# Patient Record
Sex: Male | Born: 1945
Health system: Southern US, Community
[De-identification: ages and names within clinical notes are randomized; demographics above are authoritative.]

## PROBLEM LIST (undated history)

## (undated) DIAGNOSIS — I214 Non-ST elevation (NSTEMI) myocardial infarction: Secondary | ICD-10-CM

## (undated) DIAGNOSIS — I2510 Atherosclerotic heart disease of native coronary artery without angina pectoris: Secondary | ICD-10-CM

## (undated) DIAGNOSIS — I10 Essential (primary) hypertension: Secondary | ICD-10-CM

## (undated) DIAGNOSIS — N529 Male erectile dysfunction, unspecified: Secondary | ICD-10-CM

## (undated) DIAGNOSIS — E785 Hyperlipidemia, unspecified: Secondary | ICD-10-CM

## (undated) DIAGNOSIS — E669 Obesity, unspecified: Secondary | ICD-10-CM

## (undated) HISTORY — DX: Hyperlipidemia, unspecified: E78.5

## (undated) HISTORY — DX: Obesity, unspecified: E66.9

## (undated) HISTORY — DX: Non-ST elevation (NSTEMI) myocardial infarction: I21.4

## (undated) HISTORY — DX: Atherosclerotic heart disease of native coronary artery without angina pectoris: I25.10

## (undated) HISTORY — DX: Male erectile dysfunction, unspecified: N52.9

## (undated) HISTORY — DX: Essential (primary) hypertension: I10

---
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
## 1964-06-10 HISTORY — PX: CYST EXCISION: SHX5701

## 2002-06-10 HISTORY — PX: LEG SURGERY: SHX1003

## 2003-03-06 ENCOUNTER — Inpatient Hospital Stay (HOSPITAL_COMMUNITY): Admission: EM | Admit: 2003-03-06 | Discharge: 2003-03-08 | Payer: Self-pay | Admitting: *Deleted

## 2003-03-06 ENCOUNTER — Encounter: Payer: Self-pay | Admitting: Orthopedic Surgery

## 2003-03-06 ENCOUNTER — Encounter: Payer: Self-pay | Admitting: *Deleted

## 2007-10-12 ENCOUNTER — Ambulatory Visit: Payer: Self-pay | Admitting: *Deleted

## 2007-10-12 ENCOUNTER — Encounter: Payer: Self-pay | Admitting: Emergency Medicine

## 2007-10-12 ENCOUNTER — Inpatient Hospital Stay (HOSPITAL_COMMUNITY): Admission: AD | Admit: 2007-10-12 | Discharge: 2007-10-14 | Payer: Self-pay | Admitting: Cardiology

## 2007-10-12 DIAGNOSIS — I214 Non-ST elevation (NSTEMI) myocardial infarction: Secondary | ICD-10-CM

## 2007-10-12 HISTORY — DX: Non-ST elevation (NSTEMI) myocardial infarction: I21.4

## 2007-10-13 HISTORY — PX: CORONARY ANGIOPLASTY WITH STENT PLACEMENT: SHX49
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
## 2010-10-23 NOTE — Discharge Summary (Signed)
NAMEMELDRICK, Jesus [name]                  ACCOUNT NO.:  [id number]   MEDICAL RECORD NO.:  [id number]          PATIENT TYPE:  INP   LOCATION:  6532                         FACILITY:  [facility]   PHYSICIAN:  Mobolaji B. Bakare, M.D.DATE OF BIRTH:  12-01-1945   DATE OF ADMISSION:  10/12/2007  DATE OF DISCHARGE:  10/14/2007                               DISCHARGE SUMMARY   PRIMARY CARE PHYSICIAN:  Jonita Albee, MD   FINAL DIAGNOSES:  1. Non-ST elevation myocardial infarction.  2. Hyperlipidemia.  3. Hypertension.  4. Obesity.   PROCEDURE:  1. Cardiac catheterization done by Dr. Jacinto Halim on Oct 13, 2007.  2. Chest x-ray done on Oct 12, 2007 showed cardiomegaly without      evidence of acute cardiopulmonary disease.   CONSULTATIONS:  Cardiology consult provided by Dr. Tawni Millers.  Hochrein.   BRIEF HISTORY:  Please refer to the admission H and P dictated by Dr.  Hannah Beat.  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  He has history of hypertension and  dyslipidemia and has been on some blood pressure pills and aspirin prior  to admission.  He came to the emergency room and was evaluated and felt  to have unstable angina.  The patient was admitted and started on  aspirin, beta blocker at [facility].  He was evaluated by Dr. Jacinto Halim.  The  patient was subsequently started on Integrilin, Lovenox, and Plavix.   He underwent cardiac catheterization on Oct 13, 2007.  Results include  75% stenosis mid LAD and mid circumflex disease.  The patient had stent  placement during the procedure to both mid circumflex and mid LAD.  Post-  procedure, the patient was continued on Integrilin protocol and this has  been discontinued along with Lovenox.  The patient is currently on  aspirin, Lopressor, Plavix, Crestor, and lisinopril.   Risk factors were assessed during the course of hospitalization.  Fasting lipid profile showed triglyceride of 405 making it impossible to  calculate LDL.  His HDL was 19.  The patient does have history of  dyslipidemia.  He was not on anticholesterol medication prior to  hospitalization.  He has been started on Crestor.   It should be mentioned that the patient ruled in for myocardial  infarction with elevated CK-MB of 20 and troponin of 2.30 at the  maximum.  The patient was seen by cardiac rehabilitation team and he  would follow up with outpatient recommendations.   DISCHARGE CONDITION:  Stable.   DISCHARGE VITAL SIGNS:  Blood pressure 129/73, O2 sats of 97% on room  air, and heart rate of 62%.   DISCHARGE MEDICATIONS:  1. Plavix 75 mg 1 daily.  2. Aspirin 325 mg daily.  3. Crestor 40 mg daily.  4. Toprol XL 25 mg daily.  5. Lisinopril 5 mg daily.  6. Nitroglycerin p.r.n.   DISCHARGE INSTRUCTIONS:  1. Resume work in 2 weeks/when cleared by Dr. Perrin Maltese.  2. Follow up with Dr. Perrin Maltese in  10-14 days.  Follow up with Dr. Jacinto Halim      in 2-3 weeks.  3. Continue with cardiac rehabilitation.  4. Follow up with Dr. Jacinto Halim with 2D echocardiogram if deemed      necessary.      Mobolaji B. Corky Downs, M.D.  Electronically Signed     MBB/MEDQ  D:  10/14/2007  T:  10/15/2007  Job:  [id number]   cc:   Jonita Albee, M.D.  Cristy Hilts. Jacinto Halim, MD

## 2010-10-23 NOTE — Consult Note (Signed)
Jesus [name], ARNETTE NO.:  [id number]   MEDICAL RECORD NO.:  [id number]          PATIENT TYPE:  INP   LOCATION:  4710                         FACILITY:  [facility]   PHYSICIAN:  Rod Holler, MD     DATE OF BIRTH:  1946/01/31   DATE OF CONSULTATION:  10/12/2007  DATE OF DISCHARGE:                                 CONSULTATION   REFERRING PHYSICIAN:  Vonna Kotyk R. Jacinto Halim, MD.   CHIEF COMPLAINT:  Chest pain.   HISTORY OF PRESENT ILLNESS:  Jesus [name] is a very pleasant 65 year old  male with a history of hypertension, possible hyperlipidemia who  presents to the emergency department with complaints of chest pain.  The  patient has had a recent history of exertional chest pain which is  relieved by rest.  Today at work, the patient had onset of chest  tightness with radiation to his left upper extremity, associated nausea  and diaphoresis.  The chest pain was stuttering throughout the day and  ultimately was not relieved by rest.  He presents to the emergency  department and his chest discomfort was improved with sublingual  nitroglycerin.  The patient has had no complaints of palpitations, no  PND, orthopnea, no lower extremity swelling, no syncope, or presyncope.  The patient wanted a second opinion regarding cardiac catheterization.   PAST MEDICAL HISTORY.:  1. Hypertension.  2. Hyperlipidemia.   MEDICATIONS:  1. Aspirin.  2. Plavix.  3. Integrilin.  4. Lopressor.  5. Lovenox.   ALLERGIES:  No known drug allergies.   SOCIAL HISTORY:  The patient is a former cigarette user, currently  smokes cigars.   FAMILY HISTORY:  A brother with history of congenital heart defects and  is status post heart transplant, no other history of premature coronary  disease.   REVIEW OF SYSTEMS:  All systems reviewed in detail are negative, except  as noted in the present illness.   PHYSICAL EXAMINATION:  VITAL SIGNS:  Temperature 97.3, heart rate 52,  respiratory rate 18, blood  pressure 153/87, and oxygen saturation 97%.  GENERAL:  Well-developed, slightly obese male, alert and oriented x3, no  apparent distress.  HEENT:  Atraumatic, normocephalic.  Pupils equal, round, and reactive to  light.  NECK:  Supple.  No adenopathy.  No JVD.  No carotid bruits.  CHEST/LUNGS:  Clear to auscultation bilaterally with equal bilateral  breath sounds.  CORONARY:  Regular rate and rhythm, normal rate, normal S1 and S2, no  murmurs, rubs or gallops.  ABDOMEN:  Soft, nontender, and nondistended.  EXTREMITIES:  No clubbing, cyanosis or edema.  NEUROLOGIC:  No focal deficits.   LABORATORY DATA:  Sodium 139, potassium 4.5, chloride 106, bicarb 26,  BUN 12, creatinine 1.1, glucose 95.  White blood cell count 6.6,  hematocrit 45.8, platelet count 233, CK-MB 1.9, 12.1, troponin less than  0.05, 0.72.  EKG is unavailable.  Chest x-ray shows possible  cardiomegaly.   IMPRESSION AND PLAN:  Jesus [name] is a 65 year old male who presented with  a non-ST elevation MI, wanted a second opinion regarding cardiac  catheterization.   PLAN:  Agree with plan for cardiac catheterization, and I have discussed  this at length with the patient and his wife.      Rod Holler, MD  Electronically Signed     TRK/MEDQ  D:  10/12/2007  T:  10/13/2007  Job:  [id number]

## 2010-10-23 NOTE — Consult Note (Signed)
[name], [name] NO.:  [id number]   MEDICAL RECORD NO.:  [id number]          PATIENT TYPE:  EMS   LOCATION:  ED                           [facility]ACILITY:  [facility]   PHYSICIAN:  Cristy Hilts. Jacinto Halim, MD       DATE OF BIRTH:  18-Mar-1946   DATE OF CONSULTATION:  10/12/2007  DATE OF DISCHARGE:                                 CONSULTATION   REFERRING PHYSICIAN:  Hettie Holstein, D.O.   REASON FOR CONSULTATION:  Chest pain and unstable angina.   HISTORY:  [name] is a pleasant 65 year old gentleman with a  history of hypertension and also hyperlipidemia who had been doing well  until this morning. He was at work place and suddenly developed a  heaviness in the middle of his chest with radiation to both his arms.  This episode lasted for about 4 hours on and off.  It was waxing and  waning in character.  This was associated with mild nausea and  diaphoresis.  Given the persistence of this chest discomfort, he drove  himself to [facility] emergency room, and he received aspirin and also  three sublingual nitroglycerin.  He eventually got relief of his chest  discomfort.   Presently he has no chest pain, no shortness of breath, no paroxysmal  nocturnal dyspnea or orthopnea.   REVIEW OF SYSTEMS:  He denies any bowel or bladder changes.  Denies any  neurologic weakness, tenderness,  recent weight loss.  He is not a  diabetic.  Other systems are negative.   PRESENT MEDICATIONS:  Blood pressure medications, names not known.   ALLERGIES:  No known drug allergies.   PAST MEDICAL HISTORY:  Significant for:  1. Hypertension.  2. Hyperlipidemia.   FAMILY HISTORY:  There is no history of premature coronary artery  disease in the family.  One of his brothers has a congenital cardiac  abnormality and needed to have cardiac transplant at the age of 20, but  he was one of twins, and he was born with butt problems.   SOCIAL HISTORY:  He is married and lives with his wife.  He  drinks  alcohol occasionally.  Does not smoke cigarettes or use any tobacco  products.  Does not have a formal exercise program but keeps himself  physically active at work place.   PHYSICAL EXAMINATION:  GENERAL:  He is well built, appears to be  overweight.  He is in no acute distress.  VITAL SIGNS:  Heart rate of 52 beats per minute, respirations 14,  blood  pressure 134/71 mmHg.  His oxygen saturation was 96% on room air.  CARDIAC:  S1 and S2 were normal.  There was no gallop or murmur.  CHEST:  Bilateral equal breath sounds, no crackles.  ABDOMEN:  Soft.  EXTREMITIES:  No edema.  Peripheral vascular exam was normal.   His EKG demonstrates normal sinus rhythm with left axis deviation.   His CBC was within normal limits.  Electrolytes with BUN and creatinine  within normal limits with BUN of 12, creatinine of 1.08.  Blood sugar  was 95.  Point-of-care markers first set:  Total CK 123, MB 1.9,  troponin of less than 0.05 which was negative.  Second set:  CPK was  155, MB 12.1 with a cardiac index of 7.8 and troponin of 0.72,  clearly  suggestive of nontransmural myocardial infarction.   IMPRESSION:  1. Non-ST-elevation myocardial infarction.  2. Hypertension.  3. Hyperlipidemia.   RECOMMENDATIONS:  The patient is presently asymptomatic.  Hence, he  needs aggressive medical therapy.  He should be continued on Lovenox and  will also start him on Integrilin.  He was already given a dose of  Plavix.  I will continue the same at this point.  I will also load him  with 80 mg Lipitor.  Will check a lipid profile on a nonfasting state  today.  1. He will eventually need cardiac catheterization in the morning      unless he develops unstable symptoms on an emergent basis at any      time.  I have explained to the patient and his wife regarding      medical therapy versus cardiac catheterization.  I also explained      risks, benefits, alternatives including less than 1% risk of  death,      stroke, heart attack and need for urgent bypass surgery with      cardiac catheterization and 2-3% with PCI.  The patient wants to      proceed with cardiac catheterization and is willing to be placed on      the catheterization board for catheterization tomorrow; however, he      would like to get a second opinion and requested Dr. Rollene Rotunda      to do the same for me.   I thank Dr. Hannah Beat for asking me to see this patient.  I will  continue to make recommendations.      Cristy Hilts. Jacinto Halim, MD  Electronically Signed     JRG/MEDQ  D:  10/12/2007  T:  10/12/2007  Job:  [id number]   cc:   Hettie Holstein, D.O.  Jonita Albee, M.D.

## 2010-10-26 NOTE — Op Note (Signed)
NAME:  Jesus [name], Jesus [name] NO.:  [id number]   MEDICAL RECORD NO.:  [id number]                   PATIENT TYPE:  INP   LOCATION:  0482                                 FACILITY:  [facility]   PHYSICIAN:  Ollen Gross, M.D.                 DATE OF BIRTH:  1946/06/07   DATE OF PROCEDURE:  03/06/2003  DATE OF DISCHARGE:                                 OPERATIVE REPORT   PREOPERATIVE DIAGNOSIS:  Left tib-fib fracture.   POSTOPERATIVE DIAGNOSIS:  Left tib-fib fracture.   PROCEDURE:  Intramedullary nailing of left tib-fib fracture.   SURGEON:  Ollen Gross, M.D.   ASSISTANT:  Alexzandrew L. Julien Girt, P.A.   ANESTHESIA:  General.   ESTIMATED BLOOD LOSS:  100 cc.   DRAINS:  Hemovac x1.   TOURNIQUET TIME:  15 min at 300 mmHg.   COMPLICATIONS:  None.   CONDITION:  Stable to recovery.   BRIEF CLINICAL NOTE:  Jesus [name] is a 65 year old male who slipped coming outside  of his garage earlier this afternoon, sustaining a twisting injury to his  left lower extremity with subsequent deformity.  Radiographs revealed an  oblique tib-fib fracture, with the tibia being midshaft and the fibula being  proximal.  He presents now for the above-mentioned procedure.   PROCEDURE IN DETAIL:  After the successful administration of general  anesthetic, a tourniquet was placed high on the left thigh.  The left lower  extremity was prepped and draped in the usual sterile fashion.  Exsanguination undertaken with Esmarch tourniquet inflated to 300 mmHg.  A  midline incision is made at the patellar tendon, just up to the inferior  pole of the patella and then went just lateral up to the tendon to get a  starting point for the intramedullary nail.  We placed the guide pin and  then was confirmed to be in good position on AP and lateral fluoroscopy.  I  had put the proximal reamer over the guide pin and removed it.  We then  placed the long beaded guide rod for the intramedullary reaming,  and was  shown to pass this across the fracture site on both the AP and lateral  views.  We then subsequently reamed up to 11 mm, and then proximally had to  ream up to 14 mm.  We then switched out to the smooth-tipped guide pin, and  again was confirmed to be in the medullary canal across the fracture site.  The length is determined to be 33 mm.  The diameter was determined to be 10.  We took a 10 x 33 nail, impacted it and was shown to cross the fracture site  going through distal fragment; had an excellent reduction, which was near  anatomic.  We then used a proximal guide to place the proximal interlocking  screw through a small stab incision.  We removed the proximal guide  and took  AP views of the proximal tibial fracture and distal tibia; I was satisfied  with the position of the nail.  The fracture was reduced in essentially  anatomic position.   Using the freehand technique under lateral fluoroscopy, we placed two distal  interlocked screws of appropriate length.  This was confirmed to be through  the nail, both on a lateral and of good length on the AP.  I then thoroughly  irrigated all of the wounds and closed the interlocked incisions with  staples.  I then placed a Hemovac drain, closed the deep layer of the  proximal incision with 0 Vicryl, and then let the tourniquet down for a  total time of 58 min.  Minor bleeding was stopped with cautery.  Subcutaneous was closed with interrupted 2-0 Vicryl and skin with staples.  A bulky sterile dressing was applied.  He was placed into a posterior sugar  tong splint.  Awakened and transported to recovery room in stable condition.                                               Ollen Gross, M.D.    FA/MEDQ  D:  03/06/2003  T:  03/07/2003  Job:  [id number]

## 2013-03-08 ENCOUNTER — Telehealth: Payer: Self-pay | Admitting: Cardiovascular Disease

## 2013-03-08 DIAGNOSIS — E782 Mixed hyperlipidemia: Secondary | ICD-10-CM

## 2013-03-08 DIAGNOSIS — Z79899 Other long term (current) drug therapy: Secondary | ICD-10-CM

## 2013-03-08 NOTE — Telephone Encounter (Signed)
Please send a lab order to patient before his appt on 03/16/13...   Thanks

## 2013-03-08 NOTE — Telephone Encounter (Signed)
Lab(s) ordered and Lab slip mailed.  

## 2013-03-11 LAB — COMPREHENSIVE METABOLIC PANEL WITH GFR
ALT: 19 U/L (ref 0–53)
AST: 16 U/L (ref 0–37)
Albumin: 4.4 g/dL (ref 3.5–5.2)
Alkaline Phosphatase: 61 U/L (ref 39–117)
BUN: 19 mg/dL (ref 6–23)
CO2: 26 meq/L (ref 19–32)
Calcium: 9.5 mg/dL (ref 8.4–10.5)
Chloride: 101 meq/L (ref 96–112)
Creat: 1.07 mg/dL (ref 0.50–1.35)
Glucose, Bld: 99 mg/dL (ref 70–99)
Potassium: 4.6 meq/L (ref 3.5–5.3)
Sodium: 134 meq/L — ABNORMAL LOW (ref 135–145)
Total Bilirubin: 0.8 mg/dL (ref 0.3–1.2)
Total Protein: 7.2 g/dL (ref 6.0–8.3)

## 2013-03-11 LAB — LIPID PANEL
Cholesterol: 200 mg/dL (ref 0–200)
HDL: 31 mg/dL — ABNORMAL LOW
LDL Cholesterol: 112 mg/dL — ABNORMAL HIGH (ref 0–99)
Total CHOL/HDL Ratio: 6.5 ratio
Triglycerides: 284 mg/dL — ABNORMAL HIGH
VLDL: 57 mg/dL — ABNORMAL HIGH (ref 0–40)

## 2013-03-12 NOTE — Telephone Encounter (Signed)
Appt. 03/18/13.  Will discuss labs at that time.

## 2013-03-18 ENCOUNTER — Ambulatory Visit (INDEPENDENT_AMBULATORY_CARE_PROVIDER_SITE_OTHER): Payer: Medicare Other | Admitting: Cardiovascular Disease

## 2013-03-18 ENCOUNTER — Ambulatory Visit: Payer: Self-pay | Admitting: Cardiovascular Disease

## 2013-03-18 ENCOUNTER — Encounter: Payer: Self-pay | Admitting: Cardiovascular Disease

## 2013-03-18 VITALS — BP 130/80 | HR 62 | Ht 73.5 in | Wt 272.2 lb

## 2013-03-18 DIAGNOSIS — I10 Essential (primary) hypertension: Secondary | ICD-10-CM

## 2013-03-18 DIAGNOSIS — I2510 Atherosclerotic heart disease of native coronary artery without angina pectoris: Secondary | ICD-10-CM | POA: Insufficient documentation

## 2013-03-18 DIAGNOSIS — E669 Obesity, unspecified: Secondary | ICD-10-CM

## 2013-03-18 DIAGNOSIS — E782 Mixed hyperlipidemia: Secondary | ICD-10-CM

## 2013-03-18 DIAGNOSIS — N529 Male erectile dysfunction, unspecified: Secondary | ICD-10-CM

## 2013-03-18 DIAGNOSIS — Z79899 Other long term (current) drug therapy: Secondary | ICD-10-CM

## 2013-03-18 NOTE — Assessment & Plan Note (Signed)
He ran out of his Restoril just a few days before having his labs drawn and there is a marked worsening of his lipid profile compared to a year ago. We refilled his prescription. I suspect that he will return to previous years baseline, with residual low HDL cholesterol. Again weight loss is stressed as a way to improve this.

## 2013-03-18 NOTE — Assessment & Plan Note (Signed)
Asymptomatic at rest and with exertion. He has not had any coronary events since the initial presentation 2009. In 2010 he had a nuclear perfusion study which showed normal perfusion and an ejection fraction of 60%. He had good exercise capacity at 11 minutes. Strongly encouraged to continue being physically active. Weight loss is important.
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
## 2013-03-18 NOTE — Assessment & Plan Note (Signed)
Mild orthostatic hypotension. Encouraged to stay very well hydrated and avoid sudden changes in position.

## 2013-03-18 NOTE — Patient Instructions (Signed)
Your physician recommends that you schedule a follow-up appointment in: One year.  Have FASTING lab work done in before your next visit.

## 2013-03-18 NOTE — Assessment & Plan Note (Signed)
Good response to PDE inhibitors, but cannot afford them.

## 2013-03-29 ENCOUNTER — Ambulatory Visit: Payer: Self-pay | Admitting: Cardiovascular Disease

## 2013-04-18 ENCOUNTER — Other Ambulatory Visit: Payer: Self-pay | Admitting: Cardiovascular Disease

## 2013-04-19 NOTE — Telephone Encounter (Signed)
Rx was sent to pharmacy electronically. 

## 2013-06-28 ENCOUNTER — Other Ambulatory Visit: Payer: Self-pay | Admitting: *Deleted

## 2013-06-28 MED ORDER — ROSUVASTATIN CALCIUM 20 MG PO TABS: 20.0000 mg | ORAL_TABLET | Freq: Every day | ORAL | Status: DC

## 2013-08-11 ENCOUNTER — Encounter (HOSPITAL_COMMUNITY): Payer: Self-pay | Admitting: Emergency Medicine

## 2013-08-11 ENCOUNTER — Emergency Department (HOSPITAL_COMMUNITY)
Admission: EM | Admit: 2013-08-11 | Discharge: 2013-08-11 | Disposition: A | Discharge status: Home / Self Care | Payer: Medicare Other | Attending: Emergency Medicine | Admitting: Emergency Medicine

## 2013-08-11 DIAGNOSIS — I10 Essential (primary) hypertension: Secondary | ICD-10-CM | POA: Insufficient documentation

## 2013-08-11 DIAGNOSIS — Z79899 Other long term (current) drug therapy: Secondary | ICD-10-CM | POA: Insufficient documentation

## 2013-08-11 DIAGNOSIS — I252 Old myocardial infarction: Secondary | ICD-10-CM | POA: Insufficient documentation

## 2013-08-11 DIAGNOSIS — Z87891 Personal history of nicotine dependence: Secondary | ICD-10-CM | POA: Insufficient documentation

## 2013-08-11 DIAGNOSIS — E785 Hyperlipidemia, unspecified: Secondary | ICD-10-CM | POA: Insufficient documentation

## 2013-08-11 DIAGNOSIS — T783XXA Angioneurotic edema, initial encounter: Secondary | ICD-10-CM | POA: Insufficient documentation

## 2013-08-11 DIAGNOSIS — Z87448 Personal history of other diseases of urinary system: Secondary | ICD-10-CM | POA: Insufficient documentation

## 2013-08-11 DIAGNOSIS — Z9861 Coronary angioplasty status: Secondary | ICD-10-CM | POA: Insufficient documentation

## 2013-08-11 DIAGNOSIS — Z7982 Long term (current) use of aspirin: Secondary | ICD-10-CM | POA: Insufficient documentation

## 2013-08-11 DIAGNOSIS — I2510 Atherosclerotic heart disease of native coronary artery without angina pectoris: Secondary | ICD-10-CM | POA: Insufficient documentation

## 2013-08-11 MED ORDER — PREDNISONE 10 MG PO TABS: 60.0000 mg | ORAL_TABLET | Freq: Every day | ORAL | Status: DC

## 2013-08-11 MED ORDER — FAMOTIDINE IN NACL 20-0.9 MG/50ML-% IV SOLN
20.0000 mg | Freq: Once | INTRAVENOUS | Status: AC
  Administered 2013-08-11: 20 mg via INTRAVENOUS
  Filled 2013-08-11: qty 50

## 2013-08-11 MED ORDER — METOPROLOL SUCCINATE ER 25 MG PO TB24: 25.0000 mg | ORAL_TABLET | Freq: Every day | ORAL | Status: DC

## 2013-08-11 MED ORDER — FAMOTIDINE 20 MG PO TABS: 20.0000 mg | ORAL_TABLET | Freq: Two times a day (BID) | ORAL | Status: DC

## 2013-08-11 MED ORDER — METHYLPREDNISOLONE SODIUM SUCC 125 MG IJ SOLR
125.0000 mg | Freq: Once | INTRAMUSCULAR | Status: AC
  Administered 2013-08-11: 125 mg via INTRAVENOUS
  Filled 2013-08-11: qty 2

## 2013-08-11 MED ORDER — SODIUM CHLORIDE 0.9 % IV BOLUS (SEPSIS)
1000.0000 mL | Freq: Once | INTRAVENOUS | Status: AC
  Administered 2013-08-11: 1000 mL via INTRAVENOUS

## 2013-08-11 MED ORDER — DIPHENHYDRAMINE HCL 50 MG/ML IJ SOLN
25.0000 mg | Freq: Once | INTRAMUSCULAR | Status: AC
  Administered 2013-08-11: 25 mg via INTRAVENOUS
  Filled 2013-08-11: qty 1

## 2013-08-11 NOTE — ED Provider Notes (Signed)
Medical screening examination/treatment/procedure(s) were conducted as a shared visit with non-physician practitioner(s) and myself.  I personally evaluated the patient during the encounter.   EKG Interpretation None      Pt feels better. Swelling improved. Isolated to upper lip. Stop lisinopril. Placed on toprol xl. pcp follow up.  Supervised emergency apartment for nearly 3 hours  Kristyna Bradstreet M CampLyanne Coos, MD 08/11/13 [phone]

## 2013-08-11 NOTE — ED Notes (Signed)
Dr Campos in room 

## 2013-08-11 NOTE — Discharge Instructions (Signed)
Angioedema Angioedema is a sudden swelling of tissues, often of the skin. It can occur on the face or genitals or in the abdomen or other body parts. The swelling usually develops over a short period and gets better in 24 to 48 hours. It often begins during the night and is found when the person wakes up. The person may also get red, itchy patches of skin (hives). Angioedema can be dangerous if it involves swelling of the air passages.  Depending on the cause, episodes of angioedema may only happen once, come back in unpredictable patterns, or repeat for several years and then gradually fade away.  CAUSES  Angioedema can be caused by an allergic reaction to various triggers. It can also result from nonallergic causes, including reactions to drugs, immune system disorders, viral infections, or an abnormal gene that is passed to you from your parents (hereditary). For some people with angioedema, the cause is unknown.  Some things that can trigger angioedema include:   Foods.   Medicines, such as ACE inhibitors, ARBs, nonsteroidal anti-inflammatory agents, or estrogen.   Latex.   Animal saliva.   Insect stings.   Dyes used in X-rays.   Mild injury.   Dental work.  Surgery.  Stress.   Sudden changes in temperature.   Exercise. SIGNS AND SYMPTOMS   Swelling of the skin.  Hives. If these are present, there is also intense itching.  Redness in the affected area.   Pain in the affected area.  Swollen lips or tongue.  Breathing problems. This may happen if the air passages swell.  Wheezing. If internal organs are involved, there may be:   Nausea.   Abdominal pain.   Vomiting.   Difficulty swallowing.   Difficulty passing urine. DIAGNOSIS   Your health care provider will examine the affected area and take a medical and family history.  Various tests may be done to help determine the cause. Tests may include:  Allergy skin tests to see if the problem  is an allergic reaction.   Blood tests to check for hereditary angioedema.   Tests to check for underlying diseases that could cause the condition.   A review of your medicines, including over the counter medicines, may be done. TREATMENT  Treatment will depend on the cause of the angioedema. Possible treatments include:   Removal of anything that triggered the condition (such as stopping certain medicines).   Medicines to treat symptoms or prevent attacks. Medicines given may include:   Antihistamines.   Epinephrine injection.   Steroids.   Hospitalization may be required for severe attacks. If the air passages are affected, it can be an emergency. Tubes may need to be placed to keep the airway open. HOME CARE INSTRUCTIONS   Only take over-the-counter or prescription medicines as directed by your health care provider.  If you were given medicines for emergency allergy treatment, always carry them with you.  Wear a medical bracelet as directed by your health care provider.   Avoid known triggers. SEEK MEDICAL CARE IF:   You have repeat attacks of angioedema.   Your attacks are more frequent or more severe despite preventive measures.   You have hereditary angioedema and are considering having children. It is important to discuss the risks of passing the condition on to your children with your health care provider. SEEK IMMEDIATE MEDICAL CARE IF:   You have severe swelling of the mouth, tongue, or lips.  You have difficulty breathing.   You have difficulty swallowing.     You faint. MAKE SURE YOU:  Understand these instructions.  Will watch your condition.  Will get help right away if you are not doing well or get worse. Document Released: 08/05/2001 Document Revised: 03/17/2013 Document Reviewed: 01/18/2013 [employer] Patient Information 2014 [employer].  
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
## 2013-08-11 NOTE — ED Notes (Addendum)
Presents with angioedema to top lip began at 7 pm and has continued to worsen. Airway is clear and intact. Pt able to speak in full sentences. Takes lisinopril

## 2013-08-12 ENCOUNTER — Telehealth: Payer: Self-pay | Admitting: Cardiovascular Disease

## 2013-08-12 NOTE — Telephone Encounter (Signed)
Returned call.  Left message to call back before 4pm.  

## 2013-08-12 NOTE — Telephone Encounter (Signed)
States he took Metoprolol in the past and had slight dizziness.  For this reason he was switched to Lisinopril.  He wants to take the pills they gave him in the ER and if he has no problems he will call and will send a Rx to the pharmacy.  Also suggested he make an appt if he want to consider another med.  Voiced understanding and will call before he runs out of the Metoprolol.

## 2013-08-12 NOTE — Telephone Encounter (Signed)
[facility] [facility] [place] [facility] [place], The doctor said it was coming from his Lisipril. He wants to know what to take now?

## 2013-08-12 NOTE — Telephone Encounter (Signed)
ER note reviewed.  Lisinopril was dc'd and Toprol XL was started, #20 rx'd.  Will defer to Dr. Salena Saner for advice.

## 2013-08-12 NOTE — Telephone Encounter (Signed)
Toprol seems appropriate. Please make sure angioedema from ACE inh is on his allergy list. [facility]

## 2013-08-31 ENCOUNTER — Other Ambulatory Visit: Payer: Self-pay

## 2013-08-31 MED ORDER — METOPROLOL SUCCINATE ER 25 MG PO TB24: 25.0000 mg | ORAL_TABLET | Freq: Every day | ORAL | Status: DC

## 2013-08-31 NOTE — Telephone Encounter (Signed)
Rx was sent to pharmacy electronically. 

## 2013-09-20 ENCOUNTER — Other Ambulatory Visit: Payer: Self-pay | Admitting: *Deleted

## 2013-09-20 MED ORDER — METOPROLOL SUCCINATE ER 25 MG PO TB24: 25.0000 mg | ORAL_TABLET | Freq: Every day | ORAL | Status: DC

## 2013-09-20 NOTE — Telephone Encounter (Signed)
Rx was sent to pharmacy electronically. 

## 2013-09-30 NOTE — Telephone Encounter (Signed)
Closed encounter °

## 2014-02-01 ENCOUNTER — Telehealth: Payer: Self-pay | Admitting: Cardiovascular Disease

## 2014-02-01 ENCOUNTER — Other Ambulatory Visit: Payer: Self-pay | Admitting: *Deleted

## 2014-02-01 DIAGNOSIS — I10 Essential (primary) hypertension: Secondary | ICD-10-CM

## 2014-02-01 DIAGNOSIS — E785 Hyperlipidemia, unspecified: Secondary | ICD-10-CM

## 2014-02-01 NOTE — Telephone Encounter (Signed)
Call and notified patient that he will need labs drawn. Lab slips will be mailed to him.

## 2014-02-01 NOTE — Telephone Encounter (Signed)
Please mail a lab order to [name] if he is to have lbs done before his offie visit on 03/23/14 at 8am..  Thanks

## 2014-03-14 LAB — LIPID PANEL
Cholesterol: 113 mg/dL (ref 0–200)
HDL: 28 mg/dL — ABNORMAL LOW (ref >39)
LDL CALC: 46 mg/dL (ref 0–99)
Total CHOL/HDL Ratio: 4 Ratio
Triglycerides: 194 mg/dL — ABNORMAL HIGH (ref <150)
VLDL: 39 mg/dL (ref 0–40)

## 2014-03-14 LAB — CBC
HCT: 46.7 % (ref 39.0–52.0)
Hemoglobin: 16.2 g/dL (ref 13.0–17.0)
MCH: 31.9 pg (ref 26.0–34.0)
MCHC: 34.7 g/dL (ref 30.0–36.0)
MCV: 91.9 fL (ref 78.0–100.0)
Platelets: 239 10*3/uL (ref 150–400)
RBC: 5.08 MIL/uL (ref 4.22–5.81)
RDW: 13.7 % (ref 11.5–15.5)
WBC: 6.2 10*3/uL (ref 4.0–10.5)

## 2014-03-23 ENCOUNTER — Encounter: Payer: Self-pay | Admitting: Cardiovascular Disease

## 2014-03-23 ENCOUNTER — Ambulatory Visit (INDEPENDENT_AMBULATORY_CARE_PROVIDER_SITE_OTHER): Payer: Medicare Other | Admitting: Cardiovascular Disease

## 2014-03-23 VITALS — BP 150/82 | HR 65 | Ht 73.0 in | Wt 282.3 lb

## 2014-03-23 DIAGNOSIS — I10 Essential (primary) hypertension: Secondary | ICD-10-CM

## 2014-03-23 DIAGNOSIS — N529 Male erectile dysfunction, unspecified: Secondary | ICD-10-CM

## 2014-03-23 DIAGNOSIS — I2510 Atherosclerotic heart disease of native coronary artery without angina pectoris: Secondary | ICD-10-CM

## 2014-03-23 DIAGNOSIS — E669 Obesity, unspecified: Secondary | ICD-10-CM

## 2014-03-23 DIAGNOSIS — E782 Mixed hyperlipidemia: Secondary | ICD-10-CM

## 2014-03-23 MED ORDER — TADALAFIL 20 MG PO TABS: 20.0000 mg | ORAL_TABLET | Freq: Every day | ORAL | Status: DC | PRN
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
## 2014-03-23 NOTE — Patient Instructions (Signed)
Your physician encouraged you to lose weight for better health.  Dr. Croitoru recommends that you schedule a follow-up appointment in: One year.   

## 2014-05-03 ENCOUNTER — Telehealth: Payer: Self-pay | Admitting: Cardiovascular Disease

## 2014-05-03 NOTE — Telephone Encounter (Signed)
Pt. To come by and pick up 30 day free trial

## 2014-05-03 NOTE — Telephone Encounter (Signed)
Pt would like to know if he can have some coupons for Cialis. The ones he had,have expired.

## 2014-05-19 ENCOUNTER — Telehealth: Payer: Self-pay | Admitting: *Deleted

## 2014-05-19 NOTE — Telephone Encounter (Signed)
Pt declined flu shot °

## 2014-09-15 ENCOUNTER — Other Ambulatory Visit: Payer: Self-pay | Admitting: Cardiovascular Disease

## 2014-09-15 NOTE — Telephone Encounter (Signed)
LMOMTCB

## 2014-09-15 NOTE — Telephone Encounter (Signed)
°  1. Which medications need to be refilled? Crestor-generic if there is one,of not please call the pt  2. Which pharmacy is medication to be sent to? [facility]  3. Do they need a 30 day or 90 day supply? 90 and refills  4. Would they like a call back once the medication has been sent to the pharmacy? yes

## 2014-09-16 MED ORDER — ROSUVASTATIN CALCIUM 20 MG PO TABS: 20.0000 mg | ORAL_TABLET | Freq: Every day | ORAL | Status: DC

## 2014-09-16 NOTE — Telephone Encounter (Signed)
Refilled to [facility].

## 2014-09-21 ENCOUNTER — Encounter: Payer: Self-pay | Admitting: *Deleted

## 2014-11-24 ENCOUNTER — Encounter: Payer: Self-pay | Admitting: *Deleted

## 2014-12-08 ENCOUNTER — Encounter: Payer: Self-pay | Admitting: *Deleted

## 2015-03-28 ENCOUNTER — Ambulatory Visit (INDEPENDENT_AMBULATORY_CARE_PROVIDER_SITE_OTHER): Payer: Medicare Other

## 2015-03-28 ENCOUNTER — Telehealth: Payer: Self-pay | Admitting: Cardiovascular Disease

## 2015-03-28 ENCOUNTER — Ambulatory Visit (INDEPENDENT_AMBULATORY_CARE_PROVIDER_SITE_OTHER): Payer: Medicare Other | Admitting: Internal Medicine

## 2015-03-28 VITALS — BP 138/82 | HR 71 | Temp 98.6°F | Resp 16 | Ht 74.0 in | Wt 269.0 lb

## 2015-03-28 DIAGNOSIS — M545 Low back pain: Secondary | ICD-10-CM | POA: Diagnosis not present

## 2015-03-28 DIAGNOSIS — M79605 Pain in left leg: Principal | ICD-10-CM

## 2015-03-28 DIAGNOSIS — I700 Atherosclerosis of aorta: Secondary | ICD-10-CM

## 2015-03-28 MED ORDER — ROSUVASTATIN CALCIUM 20 MG PO TABS: 20.0000 mg | ORAL_TABLET | Freq: Every day | ORAL | Status: DC

## 2015-03-28 MED ORDER — METHOCARBAMOL 750 MG PO TABS: 750.0000 mg | ORAL_TABLET | Freq: Three times a day (TID) | ORAL | Status: DC | PRN

## 2015-03-28 MED ORDER — IBUPROFEN 600 MG PO TABS: 600.0000 mg | ORAL_TABLET | Freq: Three times a day (TID) | ORAL | Status: DC | PRN

## 2015-03-28 NOTE — Telephone Encounter (Signed)
[name] is needing a lab order sent to him before his appt on 04/28/15 at 2:30pm   Thanks

## 2015-03-28 NOTE — Telephone Encounter (Signed)
°  STAT if patient is at the pharmacy , call can be transferred to refill team.   1. Which medications need to be refilled? Crestor  2. Which pharmacy/location is medication to be sent to?[facility]  3. Do they need a 30 day or 90 day supply? 90   Also is asking for some samples of Crestor , he is completley out .Marland Kitchen. Please call

## 2015-03-28 NOTE — Telephone Encounter (Signed)
Spoke to patient. Informed no samples bc of generic availability.   30-day supply Rx sent to preferred local pharmacy, [facility]  Pt to see Dr. Royann Shiversroitoru for yearly OV in 1 month.  All needs addressed.

## 2015-03-28 NOTE — Patient Instructions (Addendum)
Lumbosacral Strain Lumbosacral strain is a strain of any of the parts that make up your lumbosacral vertebrae. Your lumbosacral vertebrae are the bones that make up the lower third of your backbone. Your lumbosacral vertebrae are held together by muscles and tough, fibrous tissue (ligaments).  CAUSES  A sudden blow to your back can cause lumbosacral strain. Also, anything that causes an excessive stretch of the muscles in the low back can cause this strain. This is typically seen when people exert themselves strenuously, fall, lift heavy objects, bend, or crouch repeatedly. RISK FACTORS  Physically demanding work.  Participation in pushing or pulling sports or sports that require a sudden twist of the back (tennis, golf, baseball).  Weight lifting.  Excessive lower back curvature.  Forward-tilted pelvis.  Weak back or abdominal muscles or both.  Tight hamstrings. SIGNS AND SYMPTOMS  Lumbosacral strain may cause pain in the area of your injury or pain that moves (radiates) down your leg.  DIAGNOSIS Your health care provider can often diagnose lumbosacral strain through a physical exam. In some cases, you may need tests such as X-ray exams.  TREATMENT  Treatment for your lower back injury depends on many factors that your clinician will have to evaluate. However, most treatment will include the use of anti-inflammatory medicines. HOME CARE INSTRUCTIONS   Avoid hard physical activities (tennis, racquetball, waterskiing) if you are not in proper physical condition for it. This may aggravate or create problems.  If you have a back problem, avoid sports requiring sudden body movements. Swimming and walking are generally safer activities.  Maintain good posture.  Maintain a healthy weight.  For acute conditions, you may put ice on the injured area.  Put ice in a plastic bag.  Place a towel between your skin and the bag.  Leave the ice on for 20 minutes, 2-3 times a day.  When the  low back starts healing, stretching and strengthening exercises may be recommended. SEEK MEDICAL CARE IF:  Your back pain is getting worse.  You experience severe back pain not relieved with medicines. SEEK IMMEDIATE MEDICAL CARE IF:   You have numbness, tingling, weakness, or problems with the use of your arms or legs.  There is a change in bowel or bladder control.  You have increasing pain in any area of the body, including your belly (abdomen).  You notice shortness of breath, dizziness, or feel faint.  You feel sick to your stomach (nauseous), are throwing up (vomiting), or become sweaty.  You notice discoloration of your toes or legs, or your feet get very cold. MAKE SURE YOU:   Understand these instructions.  Will watch your condition.  Will get help right away if you are not doing well or get worse.   This information is not intended to replace advice given to you by your health care provider. Make sure you discuss any questions you have with your health care provider.   Document Released: 03/06/2005 Document Revised: 06/17/2014 Document Reviewed: 01/13/2013 Elsevier Interactive Patient Education 2016 [place] Injury Prevention Back injuries can be very painful. They can also be difficult to heal. After having one back injury, you are more likely to injure your back again. It is important to learn how to avoid injuring or re-injuring your back. The following tips can help you to prevent a back injury. WHAT SHOULD I KNOW ABOUT PHYSICAL FITNESS?  Exercise for 30 minutes per day on most days of the week or as directed by your health care  provider. Make sure to:  Do aerobic exercises, such as walking, jogging, biking, or swimming.  Do exercises that increase balance and strength, such as tai chi and yoga. These can decrease your risk of falling and injuring your back.  Do stretching exercises to help with flexibility.  Try to develop strong abdominal muscles.  Your abdominal muscles provide a lot of the support that is needed by your back.  Maintain a healthy weight. This helps to decrease your risk of a back injury. WHAT SHOULD I KNOW ABOUT MY DIET?  Talk with your health care provider about your overall diet. Take supplements and vitamins only as directed by your health care provider.  Talk with your health care provider about how much calcium and vitamin D you need each day. These nutrients help to prevent weakening of the bones (osteoporosis). Osteoporosis can cause broken (fractured) bones, which lead to back pain.  Include good sources of calcium in your diet, such as dairy products, green leafy vegetables, and products that have had calcium added to them (fortified).  Include good sources of vitamin D in your diet, such as milk and foods that are fortified with vitamin D. WHAT SHOULD I KNOW ABOUT MY POSTURE?  Sit up straight and stand up straight. Avoid leaning forward when you sit or hunching over when you stand.  Choose chairs that have good low-back (lumbar) support.  If you work at a desk, sit close to it so you do not need to lean over. Keep your chin tucked in. Keep your neck drawn back, and keep your elbows bent at a right angle. Your arms should look like the letter "L."  Sit high and close to the steering wheel when you drive. Add a lumbar support to your car seat, if needed.  Avoid sitting or standing in one position for very long. Take breaks to get up, stretch, and walk around at least one time every hour. Take breaks every hour if you are driving for long periods of time.  Sleep on your side with your knees slightly bent, or sleep on your back with a pillow under your knees. Do not lie on the front of your body to sleep. WHAT SHOULD I KNOW ABOUT LIFTING, TWISTING, AND REACHING? Lifting and Heavy Lifting  Avoid heavy lifting, especially repetitive heavy lifting. If you must do heavy lifting:  Stretch before  lifting.  Work slowly.  Rest between lifts.  Use a tool such as a cart or a dolly to move objects if one is available.  Make several small trips instead of carrying one heavy load.  Ask for help when you need it, especially when moving big objects.  Follow these steps when lifting:  Stand with your feet shoulder-width apart.  Get as close to the object as you can. Do not try to pick up a heavy object that is far from your body.  Use handles or lifting straps if they are available.  Bend at your knees. Squat down, but keep your heels off the floor.  Keep your shoulders pulled back, your chin tucked in, and your back straight.  Lift the object slowly while you tighten the muscles in your legs, abdomen, and buttocks. Keep the object as close to the center of your body as possible.  Follow these steps when putting down a heavy load:  Stand with your feet shoulder-width apart.  Lower the object slowly while you tighten the muscles in your legs, abdomen, and buttocks. Keep the object as  close to the center of your body as possible.  Keep your shoulders pulled back, your chin tucked in, and your back straight.  Bend at your knees. Squat down, but keep your heels off the floor.  Use handles or lifting straps if they are available. Twisting and Reaching  Avoid lifting heavy objects above your waist.  Do not twist at your waist while you are lifting or carrying a load. If you need to turn, move your feet.  Do not bend over without bending at your knees.  Avoid reaching over your head, across a table, or for an object on a high surface. WHAT ARE SOME OTHER TIPS?  Avoid wet floors and icy ground. Keep sidewalks clear of ice to prevent falls.  Do not sleep on a mattress that is too soft or too hard.  Keep items that are used frequently within easy reach.  Put heavier objects on shelves at waist level, and put lighter objects on lower or higher shelves.  Find ways to  decrease your stress, such as exercise, massage, or relaxation techniques. Stress can build up in your muscles. Tense muscles are more vulnerable to injury.  Talk with your health care provider if you feel anxious or depressed. These conditions can make back pain worse.  Wear flat heel shoes with cushioned soles.  Avoid sudden movements.  Use both shoulder straps when carrying a backpack.  Do not use any tobacco products, including cigarettes, chewing tobacco, or electronic cigarettes. If you need help quitting, ask your health care provider.   This information is not intended to replace advice given to you by your health care provider. Make sure you discuss any questions you have with your health care provider.   Document Released: 07/04/2004 Document Revised: 10/11/2014 Document Reviewed: 05/31/2014 Elsevier Interactive Patient Education 2016 [place]. High Cholesterol High cholesterol refers to having a high level of cholesterol in your blood. Cholesterol is a white, waxy, fat-like protein that your body needs in small amounts. Your liver makes all the cholesterol you need. Excess cholesterol comes from the food you eat. Cholesterol travels in your bloodstream through your blood vessels. If you have high cholesterol, deposits (plaque) may build up on the walls of your blood vessels. This makes the arteries narrower and stiffer. Plaque increases your risk of heart attack and stroke. Work with your health care provider to keep your cholesterol levels in a healthy range. RISK FACTORS Several things can make you more likely to have high cholesterol. These include:   Eating foods high in animal fat (saturated fat) or cholesterol.  Being overweight.  Not getting enough exercise.  Having a family history of high cholesterol. SIGNS AND SYMPTOMS High cholesterol does not cause symptoms. DIAGNOSIS  Your health care provider can do a blood test to check whether you have high cholesterol.  If you are older than 20, your health care provider may check your cholesterol every 4-6 years. You may be checked more often if you already have high cholesterol or other risk factors for heart disease. The blood test for cholesterol measures the following:  Bad cholesterol (LDL cholesterol). This is the type of cholesterol that causes heart disease. This number should be less than 100.  Good cholesterol (HDL cholesterol). This type helps protect against heart disease. A healthy level of HDL cholesterol is 60 or higher.  Total cholesterol. This is the combined number of LDL cholesterol and HDL cholesterol. A healthy number is less than 200. TREATMENT  High cholesterol can  be treated with diet changes, lifestyle changes, and medicine.   Diet changes may include eating more whole grains, fruits, vegetables, nuts, and fish. You may also have to cut back on red meat and foods with a lot of added sugar.  Lifestyle changes may include getting at least 40 minutes of aerobic exercise three times a week. Aerobic exercises include walking, biking, and swimming. Aerobic exercise along with a healthy diet can help you maintain a healthy weight. Lifestyle changes may also include quitting smoking.  If diet and lifestyle changes are not enough to lower your cholesterol, your health care provider may prescribe a statin medicine. This medicine has been shown to lower cholesterol and also lower the risk of heart disease. HOME CARE INSTRUCTIONS  Only take over-the-counter or prescription medicines as directed by your health care provider.   Follow a healthy diet as directed by your health care provider. For instance:   Eat chicken (without skin), fish, veal, shellfish, ground [place] breast, and round or loin cuts of red meat.  Do not eat fried foods and fatty meats, such as hot dogs and salami.   Eat plenty of fruits, such as apples.   Eat plenty of vegetables, such as broccoli, potatoes, and carrots.    Eat beans, peas, and lentils.   Eat grains, such as barley, rice, couscous, and bulgur wheat.   Eat pasta without cream sauces.   Use skim or nonfat milk and low-fat or nonfat yogurt and cheeses. Do not eat or drink whole milk, cream, ice cream, egg yolks, and hard cheeses.   Do not eat stick margarine or tub margarines that contain trans fats (also called partially hydrogenated oils).   Do not eat cakes, cookies, crackers, or other baked goods that contain trans fats.   Do not eat saturated tropical oils, such as coconut and palm oil.   Exercise as directed by your health care provider. Increase your activity level with activities such as gardening or walking.   Keep all follow-up appointments.  SEEK MEDICAL CARE IF:  You are struggling to maintain a healthy diet or weight.  You need help starting an exercise program.  You need help to stop smoking. SEEK IMMEDIATE MEDICAL CARE IF:  You have chest pain.  You have trouble breathing.   This information is not intended to replace advice given to you by your health care provider. Make sure you discuss any questions you have with your health care provider.   Document Released: 05/27/2005 Document Revised: 06/17/2014 Document Reviewed: 03/19/2013 Elsevier Interactive Patient Education [employer].

## 2015-03-28 NOTE — Progress Notes (Signed)
Patient ID: Jesus [name], male   DOB: 06/11/1945, 69 y.o.   MRN: [id number]   03/28/2015 at 1:35 PM  Jesus [name] / DOB: 06/11/1945 / MRN: [id number]  Problem list reviewed and updated by me where necessary.   SUBJECTIVE  Jesus [name] is a 69 y.o. well appearing male presenting for the chief complaint of left low back pain with tingling sometimes to left lower leg. No weakness, numbness, or incontinence. He had similar injury 10 years ago. He is able to walk with pain  But no limpTwisting also causes left lbp..     He  has a past medical history of NSTEMI (non-ST elevated myocardial infarction) (HCC) (10/12/07); CAD (coronary artery disease); Dyslipidemia; Systemic hypertension; ED (erectile dysfunction); and Obesity.    Medications reviewed and updated by myself where necessary, and exist elsewhere in the encounter.   Jesus [name] is allergic to ace inhibitors. He  reports that he quit smoking about 16 years ago. He does not have any smokeless tobacco history on file. He reports that he drinks alcohol. He reports that he does not use illicit drugs. He  has no sexual activity history on file. The patient  has past surgical history that includes Coronary angioplasty with stent (10/13/2007); Leg Surgery (2004); Abdominal surgery (1961); and Cyst excision (1966).  His family history includes Heart attack in his brother; Heart failure in his brother; Stroke in his brother and mother.  ROS  OBJECTIVE  His  height is 6\' 2"  (1.88 m) and weight is 269 lb (122.018 kg). His oral temperature is 98.6 F (37 C). His blood pressure is 138/82 and his pulse is 71. His respiration is 16 and oxygen saturation is 96%.  The patient's body mass index is 34.52 kg/(m^2).  Physical Exam  Vitals reviewed. Constitutional: He is oriented to person, place, and time. He appears well-developed and well-nourished. No distress.  HENT:  Head: Normocephalic.  Eyes: EOM are normal.  Neck: Normal range of motion.  Respiratory:  Effort normal.  Musculoskeletal:       Lumbar back: He exhibits tenderness, pain and spasm. He exhibits normal range of motion, no bony tenderness, no swelling and normal pulse.       Back:  Neurological: He is alert and oriented to person, place, and time. He has normal strength. No cranial nerve deficit. Coordination and gait normal.  Skin: No rash noted.  Psychiatric: He has a normal mood and affect.   [facility] reading (PRIMARY) by  Dr.Guest. Severely calcified aorta, spondylosis at T11-L3 with large spurs, L5  Vertebral compression mild   No results found for this or any previous visit (from the past 24 hour(s)).  ASSESSMENT & PLAN  Jesus [name] was seen today for hip pain.  Diagnoses and all orders for this visit:  LBP radiating to left leg -     DG Lumbar Spine Complete

## 2015-04-25 ENCOUNTER — Telehealth: Payer: Self-pay | Admitting: Cardiovascular Disease

## 2015-04-25 DIAGNOSIS — Z79899 Other long term (current) drug therapy: Secondary | ICD-10-CM

## 2015-04-25 DIAGNOSIS — E785 Hyperlipidemia, unspecified: Secondary | ICD-10-CM

## 2015-04-25 NOTE — Telephone Encounter (Signed)
Regarding recommended labwork - Has lipid profile yearly. Will route to Dr. Royann Shiversroitoru for any other recommended labs

## 2015-04-25 NOTE — Telephone Encounter (Signed)
Pt is calling in wanting to know what labs he wanted him to have done prior to his appt on 11/18. Please f/u with pt   Thanks

## 2015-04-25 NOTE — Telephone Encounter (Signed)
Labs ordered, pt aware [facility] can [facility] and to fast for lipid test.

## 2015-04-25 NOTE — Telephone Encounter (Signed)
CM[facility] and lipid please

## 2015-04-28 ENCOUNTER — Encounter: Payer: Self-pay | Admitting: Cardiovascular Disease

## 2015-04-28 ENCOUNTER — Ambulatory Visit (INDEPENDENT_AMBULATORY_CARE_PROVIDER_SITE_OTHER): Payer: Medicare Other | Admitting: Cardiovascular Disease

## 2015-04-28 VITALS — BP 140/70 | HR 69 | Ht 74.0 in | Wt 271.5 lb

## 2015-04-28 DIAGNOSIS — I2510 Atherosclerotic heart disease of native coronary artery without angina pectoris: Secondary | ICD-10-CM

## 2015-04-28 NOTE — Patient Instructions (Signed)
Dr. Croitoru recommends that you schedule a follow-up appointment in: ONE YEAR   
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
## 2015-04-29 LAB — COMPREHENSIVE METABOLIC PANEL
ALK PHOS: 61 U/L (ref 40–115)
ALT: 15 U/L (ref 9–46)
AST: 16 U/L (ref 10–35)
Albumin: 4.3 g/dL (ref 3.6–5.1)
BILIRUBIN TOTAL: 0.7 mg/dL (ref 0.2–1.2)
BUN: 16 mg/dL (ref 7–25)
CO2: 24 mmol/L (ref 20–31)
CREATININE: 0.91 mg/dL (ref 0.70–1.18)
Calcium: 9.1 mg/dL (ref 8.6–10.3)
Chloride: 108 mmol/L (ref 98–110)
GLUCOSE: 108 mg/dL — AB (ref 65–99)
Potassium: 4.3 mmol/L (ref 3.5–5.3)
Sodium: 141 mmol/L (ref 135–146)
TOTAL PROTEIN: 6.7 g/dL (ref 6.1–8.1)

## 2015-04-29 LAB — LIPID PANEL
CHOLESTEROL: 108 mg/dL — AB (ref 125–200)
HDL: 25 mg/dL — ABNORMAL LOW (ref >=40)
LDL Cholesterol: 61 mg/dL (ref <130)
Total CHOL/HDL Ratio: 4.3 Ratio (ref <=5.0)
Triglycerides: 109 mg/dL (ref <150)
VLDL: 22 mg/dL (ref <30)

## 2015-07-07 ENCOUNTER — Telehealth: Payer: Self-pay | Admitting: Cardiovascular Disease

## 2015-07-07 MED ORDER — ROSUVASTATIN CALCIUM 20 MG PO TABS: 20.0000 mg | ORAL_TABLET | Freq: Every day | ORAL | Status: DC

## 2015-07-07 NOTE — Telephone Encounter (Signed)
Refill sent.

## 2015-07-07 NOTE — Telephone Encounter (Signed)
New message      *STAT* If patient is at the pharmacy, call can be transferred to refill team.   1. Which medications need to be refilled? (please list name of each medication and dose if known) generic crestor  2. Which pharmacy/location (including street and city if local pharmacy) is medication to be sent to? [facility] [address]  3. Do they need a 30 day or 90 day supply? 90 day supply

## 2016-01-26 ENCOUNTER — Emergency Department (HOSPITAL_COMMUNITY): Payer: Medicare Other

## 2016-01-26 ENCOUNTER — Encounter (HOSPITAL_COMMUNITY): Payer: Self-pay | Admitting: *Deleted

## 2016-01-26 ENCOUNTER — Emergency Department (HOSPITAL_COMMUNITY)
Admission: EM | Admit: 2016-01-26 | Discharge: 2016-01-26 | Disposition: A | Discharge status: Home / Self Care | Payer: Medicare Other | Attending: Emergency Medicine | Admitting: Emergency Medicine

## 2016-01-26 DIAGNOSIS — I2510 Atherosclerotic heart disease of native coronary artery without angina pectoris: Secondary | ICD-10-CM | POA: Insufficient documentation

## 2016-01-26 DIAGNOSIS — Y9301 Activity, walking, marching and hiking: Secondary | ICD-10-CM | POA: Insufficient documentation

## 2016-01-26 DIAGNOSIS — Z955 Presence of coronary angioplasty implant and graft: Secondary | ICD-10-CM | POA: Insufficient documentation

## 2016-01-26 DIAGNOSIS — F17210 Nicotine dependence, cigarettes, uncomplicated: Secondary | ICD-10-CM | POA: Diagnosis not present

## 2016-01-26 DIAGNOSIS — X509XXA Other and unspecified overexertion or strenuous movements or postures, initial encounter: Secondary | ICD-10-CM | POA: Insufficient documentation

## 2016-01-26 DIAGNOSIS — I252 Old myocardial infarction: Secondary | ICD-10-CM | POA: Diagnosis not present

## 2016-01-26 DIAGNOSIS — Y999 Unspecified external cause status: Secondary | ICD-10-CM | POA: Insufficient documentation

## 2016-01-26 DIAGNOSIS — S92352A Displaced fracture of fifth metatarsal bone, left foot, initial encounter for closed fracture: Secondary | ICD-10-CM | POA: Diagnosis not present

## 2016-01-26 DIAGNOSIS — I10 Essential (primary) hypertension: Secondary | ICD-10-CM | POA: Diagnosis not present

## 2016-01-26 DIAGNOSIS — Z79899 Other long term (current) drug therapy: Secondary | ICD-10-CM | POA: Insufficient documentation

## 2016-01-26 DIAGNOSIS — Y9259 Other trade areas as the place of occurrence of the external cause: Secondary | ICD-10-CM | POA: Insufficient documentation

## 2016-01-26 DIAGNOSIS — Z7982 Long term (current) use of aspirin: Secondary | ICD-10-CM | POA: Insufficient documentation

## 2016-01-26 DIAGNOSIS — S99912A Unspecified injury of left ankle, initial encounter: Secondary | ICD-10-CM | POA: Diagnosis present

## 2016-01-26 NOTE — ED Triage Notes (Signed)
Patient stated he was walking out the garage pulling a hose and thinks he may have stepped on the hose and twisted his ankle.  Wrapped with ace and ice pack

## 2016-01-26 NOTE — Progress Notes (Signed)
Orthopedic Tech Progress Note Patient Details:  Foye ClockJohn F Lamba 1946/02/20 [id number]  Ortho Devices Type of Ortho Device: Post (short leg) splint Ortho Device/Splint Location: lle Ortho Device/Splint Interventions: Ordered, Application   Trinna PostMartinez, Ebert Forrester J 01/26/2016, 11:15 PM

## 2016-01-26 NOTE — ED Notes (Signed)
Pt c/o pain in left ankle.  Strong pedal pulse present
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
## 2016-03-13 ENCOUNTER — Telehealth: Payer: Self-pay | Admitting: Cardiovascular Disease

## 2016-03-13 DIAGNOSIS — E785 Hyperlipidemia, unspecified: Secondary | ICD-10-CM

## 2016-03-13 NOTE — Telephone Encounter (Signed)
Spoke with pt, paperwork mailed to the patient for lab work prior to follow up appointment

## 2016-03-13 NOTE — Telephone Encounter (Signed)
Pt wants to know if he need lab work before his yearly visit on 05-08-16 please.

## 2016-03-20 ENCOUNTER — Ambulatory Visit (INDEPENDENT_AMBULATORY_CARE_PROVIDER_SITE_OTHER): Payer: Medicare Other | Admitting: Orthopaedic Surgery

## 2016-03-20 DIAGNOSIS — S92354A Nondisplaced fracture of fifth metatarsal bone, right foot, initial encounter for closed fracture: Secondary | ICD-10-CM | POA: Diagnosis not present

## 2016-05-06 ENCOUNTER — Encounter: Payer: Self-pay | Admitting: *Deleted

## 2016-05-06 LAB — COMPREHENSIVE METABOLIC PANEL
ALK PHOS: 59 U/L (ref 40–115)
ALT: 29 U/L (ref 9–46)
AST: 25 U/L (ref 10–35)
Albumin: 4.4 g/dL (ref 3.6–5.1)
BUN: 13 mg/dL (ref 7–25)
CO2: 21 mmol/L (ref 20–31)
CREATININE: 0.97 mg/dL (ref 0.70–1.18)
Calcium: 9.2 mg/dL (ref 8.6–10.3)
Chloride: 108 mmol/L (ref 98–110)
GLUCOSE: 105 mg/dL — AB (ref 65–99)
Potassium: 4.4 mmol/L (ref 3.5–5.3)
SODIUM: 141 mmol/L (ref 135–146)
Total Bilirubin: 0.6 mg/dL (ref 0.2–1.2)
Total Protein: 7.1 g/dL (ref 6.1–8.1)

## 2016-05-06 LAB — LIPID PANEL
Cholesterol: 121 mg/dL (ref <200)
HDL: 27 mg/dL — ABNORMAL LOW (ref >40)
LDL CALC: 45 mg/dL (ref <100)
Total CHOL/HDL Ratio: 4.5 Ratio (ref <5.0)
Triglycerides: 247 mg/dL — ABNORMAL HIGH (ref <150)
VLDL: 49 mg/dL — ABNORMAL HIGH (ref <30)

## 2016-05-08 ENCOUNTER — Ambulatory Visit (INDEPENDENT_AMBULATORY_CARE_PROVIDER_SITE_OTHER): Payer: Medicare Other | Admitting: Cardiovascular Disease

## 2016-05-08 ENCOUNTER — Encounter: Payer: Self-pay | Admitting: Cardiovascular Disease

## 2016-05-08 VITALS — BP 130/60 | HR 68 | Ht 73.0 in | Wt 274.0 lb

## 2016-05-08 DIAGNOSIS — E669 Obesity, unspecified: Secondary | ICD-10-CM

## 2016-05-08 DIAGNOSIS — I208 Other forms of angina pectoris: Secondary | ICD-10-CM | POA: Diagnosis not present

## 2016-05-08 DIAGNOSIS — I358 Other nonrheumatic aortic valve disorders: Secondary | ICD-10-CM

## 2016-05-08 DIAGNOSIS — R011 Cardiac murmur, unspecified: Secondary | ICD-10-CM | POA: Diagnosis not present

## 2016-05-08 DIAGNOSIS — E782 Mixed hyperlipidemia: Secondary | ICD-10-CM

## 2016-05-08 DIAGNOSIS — I25118 Atherosclerotic heart disease of native coronary artery with other forms of angina pectoris: Secondary | ICD-10-CM
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
## 2016-05-08 NOTE — Patient Instructions (Addendum)
Medication Instructions: Dr Royann Shiversroitoru recommends that you continue on your current medications as directed. Please refer to the Current Medication list given to you today.  Labwork: NONE ORDERED  Testing/Procedures: 1. Echocardiogram - Your physician has requested that you have an echocardiogram. Echocardiography is a painless test that uses sound waves to create images of your heart. It provides your doctor with information about the size and shape of your heart and how well your heart's chambers and valves are working. This procedure takes approximately one hour. There are no restrictions for this procedure. This will be performed at our [facility] location - [address], [address]  2. Exercise Myoview Stress test - Your physician has requested that you have en exercise stress myoview. For further information please visit [email or web address] Please follow instruction sheet, as given.  Follow-up: Dr Royann Shiversroitoru recommends that you schedule a follow-up appointment in 12 months. You will receive a reminder letter in the mail two months in advance. If you don't receive a letter, please call our office to schedule the follow-up appointment.  If you need a refill on your cardiac medications before your next appointment, please call your pharmacy.

## 2016-05-09 DIAGNOSIS — I358 Other nonrheumatic aortic valve disorders: Secondary | ICD-10-CM | POA: Insufficient documentation

## 2016-05-23 ENCOUNTER — Telehealth (HOSPITAL_COMMUNITY): Payer: Self-pay

## 2016-05-23 NOTE — Telephone Encounter (Signed)
Encounter complete. 

## 2016-05-28 ENCOUNTER — Ambulatory Visit (HOSPITAL_COMMUNITY)
Admission: RE | Admit: 2016-05-28 | Discharge: 2016-05-28 | Disposition: A | Discharge status: Home / Self Care | Payer: Medicare Other | Source: Ambulatory Visit | Attending: Cardiology | Admitting: Cardiology

## 2016-05-28 DIAGNOSIS — I208 Other forms of angina pectoris: Secondary | ICD-10-CM | POA: Diagnosis not present

## 2016-05-28 LAB — MYOCARDIAL PERFUSION IMAGING
CHL CUP NUCLEAR SSS: 4
LV dias vol: 49 mL (ref 62–150)
LV sys vol: 111 mL
Peak HR: 95 {beats}/min
Rest HR: 80 {beats}/min
SDS: 1
SRS: 3
TID: 1.01

## 2016-05-28 MED ORDER — TECHNETIUM TC 99M TETROFOSMIN IV KIT
32.9000 | PACK | Freq: Once | INTRAVENOUS | Status: AC | PRN
  Administered 2016-05-28: 32.9 via INTRAVENOUS
  Filled 2016-05-28: qty 33

## 2016-05-28 MED ORDER — TECHNETIUM TC 99M TETROFOSMIN IV KIT
10.5000 | PACK | Freq: Once | INTRAVENOUS | Status: AC | PRN
  Administered 2016-05-28: 10.5 via INTRAVENOUS
  Filled 2016-05-28: qty 11

## 2016-05-28 MED ORDER — REGADENOSON 0.4 MG/5ML IV SOLN
0.4000 mg | Freq: Once | INTRAVENOUS | Status: AC
  Administered 2016-05-28: 0.4 mg via INTRAVENOUS

## 2016-05-30 ENCOUNTER — Ambulatory Visit (HOSPITAL_COMMUNITY): Payer: Medicare Other | Attending: Internal Medicine

## 2016-05-30 ENCOUNTER — Other Ambulatory Visit: Payer: Self-pay

## 2016-05-30 DIAGNOSIS — I350 Nonrheumatic aortic (valve) stenosis: Secondary | ICD-10-CM | POA: Insufficient documentation

## 2016-05-30 DIAGNOSIS — R011 Cardiac murmur, unspecified: Secondary | ICD-10-CM | POA: Diagnosis not present

## 2016-07-29 ENCOUNTER — Ambulatory Visit (INDEPENDENT_AMBULATORY_CARE_PROVIDER_SITE_OTHER): Payer: PPO | Admitting: Family Medicine

## 2016-07-29 DIAGNOSIS — R6889 Other general symptoms and signs: Secondary | ICD-10-CM

## 2016-07-29 NOTE — Patient Instructions (Addendum)
  Thank you for coming in,   Please continue supportive care.   Please follow up with us if your fever doesn't improve after 7 days.   If your symptoms worsen then please seek immediate care.    Please feel free to call with any questions or concerns at any time, at [phone]. --Dr. Jordan LikesSchmitz \   IF you received an x-ray today, you will receive an invoice from [facility]. Please contact [facility] at [phone] with questions or concerns regarding your invoice.   IF you received labwork today, you will receive an invoice from [facility]. Please contact [facility] at [phone] with questions or concerns regarding your invoice.   Our billing staff will not be able to assist you with questions regarding bills from these companies.  You will be contacted with the lab results as soon as they are available. The fastest way to get your results is to activate your My Chart account. Instructions are located on the last page of this paperwork. If you have not heard from us regarding the results in 2 weeks, please contact this office.

## 2016-07-29 NOTE — Assessment & Plan Note (Signed)
Most likely he has the flu.  No crackles on exam. - Advise supportive care - Giving indications to return her to seek immediate care - Follow-up as needed

## 2016-07-29 NOTE — Progress Notes (Signed)
     Subjective:    Patient ID: Jesus [name], male    DOB: 21-Nov-1945, 71 y.o.   MRN: [id number]  Chief Complaint  Patient presents with  . possible flu    x3 days  . Generalized Body Aches  . Dizziness  . Fever    up to 101    PCP: No PCP Per Patient  HPI  This is a 71 y.o. male who is presenting with flu like symptoms. He has been taking tylenol. Having some achiness and weakness in his legs. He fell one time. He had fever of 101 last night. He took tylenol this morning. His symptoms started Friday morning. Unsure of any sick contacts. He didn't have the flu vaccine. Denies any cough. Has tried over the counter medications. Having some headaches. He takes an aspirin a day. Denies any chest pain or SOB.   Review of Systems  ROS: No unexpected weight loss, fever, chills, swelling, instability, muscle pain, numbness/tingling, redness, otherwise see HPI   Surgical:  Social:    Patient Active Problem List   Diagnosis Date Noted  . Flu-like symptoms 07/29/2016  . Aortic valve sclerosis 05/09/2016  . CAD (coronary artery disease) - NSTEMI 2009, stents mid LAD and mid LCX 03/18/2013  . Mixed hyperlipidemia 03/18/2013  . Obesity (BMI 30-39.9) 03/18/2013  . HTN (hypertension) 03/18/2013  . Erectile dysfunction 03/18/2013     Prior to Admission medications   Medication Sig Start Date End Date Taking? Authorizing Provider  aspirin 81 MG tablet Take 81 mg by mouth daily.   Yes Historical Provider, MD  rosuvastatin (CRESTOR) 20 MG tablet Take 1 tablet (20 mg total) by mouth daily. 07/07/15  Yes Thurmon FairMihai Croitoru, MD     Allergies  Allergen Reactions  . Ace Inhibitors Swelling    Angioedema 08/11/13 to lisinopril      Objective:   Physical Exam BP 112/69   Pulse 76   Temp 98.1 F (36.7 C) (Oral)   Resp 16   Ht 6\' 1"  (1.854 m)   Wt 277 lb (125.6 kg)   SpO2 97%   BMI 36.55 kg/m  Gen: NAD, alert, cooperative with exam, sweaty HEENT: NCAT, EOMI, clear conjunctiva,  oropharynx clear, supple neck no cervical lymphadenopathy, tympanic membranes clear intact bilaterally, normal turbinates, no tonsillar originates, CV: RRR, good S1/S2, no murmur, no edema, capillary refill brisk  Resp: CTABL, no wheezes, non-labored Skin: no rashes, normal turgor  Neuro: no gross deficits.  Psych: alert and oriented      Assessment & Plan:   Flu-like symptoms Most likely he has the flu.  No crackles on exam. - Advise supportive care - Giving indications to return her to seek immediate care - Follow-up as needed

## 2016-08-05 DIAGNOSIS — R05 Cough: Secondary | ICD-10-CM | POA: Diagnosis not present

## 2016-08-05 DIAGNOSIS — J189 Pneumonia, unspecified organism: Secondary | ICD-10-CM | POA: Diagnosis not present

## 2016-08-05 DIAGNOSIS — R062 Wheezing: Secondary | ICD-10-CM | POA: Diagnosis not present

## 2016-08-22 ENCOUNTER — Other Ambulatory Visit: Payer: Self-pay

## 2016-08-22 ENCOUNTER — Telehealth: Payer: Self-pay | Admitting: Cardiovascular Disease

## 2016-08-22 MED ORDER — ROSUVASTATIN CALCIUM 20 MG PO TABS: 20.0000 mg | ORAL_TABLET | Freq: Every day | ORAL | 2 refills | Status: DC

## 2016-08-22 NOTE — Telephone Encounter (Signed)
New Message     *STAT* If patient is at the pharmacy, call can be transferred to refill team.   1. Which medications need to be refilled? (please list name of each medication and dose if known) rosuvastatin (CRESTOR) 20 MG tablet  2. Which pharmacy/location (including street and city if local pharmacy) is medication to be sent to?  [facility] [place], [place] - [phone]. Do they need a 30 day or 90 day supply? 90 day supply

## 2016-10-08 DIAGNOSIS — K5730 Diverticulosis of large intestine without perforation or abscess without bleeding: Secondary | ICD-10-CM | POA: Diagnosis not present

## 2016-10-08 DIAGNOSIS — Z1211 Encounter for screening for malignant neoplasm of colon: Secondary | ICD-10-CM | POA: Diagnosis not present

## 2017-05-12 ENCOUNTER — Encounter: Payer: Self-pay | Admitting: Cardiovascular Disease

## 2017-05-12 ENCOUNTER — Ambulatory Visit: Payer: PPO | Admitting: Cardiovascular Disease

## 2017-05-12 VITALS — BP 158/88 | HR 73 | Ht 73.5 in | Wt 278.8 lb

## 2017-05-12 DIAGNOSIS — N529 Male erectile dysfunction, unspecified: Secondary | ICD-10-CM | POA: Diagnosis not present

## 2017-05-12 DIAGNOSIS — E782 Mixed hyperlipidemia: Secondary | ICD-10-CM

## 2017-05-12 DIAGNOSIS — I10 Essential (primary) hypertension: Secondary | ICD-10-CM | POA: Diagnosis not present

## 2017-05-12 DIAGNOSIS — E669 Obesity, unspecified: Secondary | ICD-10-CM

## 2017-05-12 DIAGNOSIS — I25118 Atherosclerotic heart disease of native coronary artery with other forms of angina pectoris: Secondary | ICD-10-CM

## 2017-05-12 MED ORDER — AMLODIPINE BESYLATE 5 MG PO TABS: 5.0000 mg | ORAL_TABLET | Freq: Every day | ORAL | 3 refills | Status: DC

## 2017-05-12 MED ORDER — SILDENAFIL CITRATE 50 MG PO TABS: 50.0000 mg | ORAL_TABLET | Freq: Every day | ORAL | 0 refills | Status: DC | PRN

## 2017-05-12 NOTE — Patient Instructions (Addendum)
Dr Royann Shiversroitoru has recommended making the following medication changes: 1. START Sildenafil 50 mg - take 1 tablet by mouth as needed 2. START Amlodipine 5 mg - take 1 tablet by mouth daily  Your physician recommends that you schedule a follow-up appointment in 12 months. You will receive a reminder letter in the mail two months in advance. If you don't receive a letter, please call our office to schedule the follow-up appointment.  If you need a refill on your cardiac medications before your next appointment, please call your pharmacy.
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
## 2017-05-23 ENCOUNTER — Telehealth: Payer: Self-pay

## 2017-05-23 NOTE — Telephone Encounter (Signed)
Received a phone call from [facility] stating that Sildenafil 50mg  requires a PA from Medicare, even though it WILL be denied. Since he is Dr Croituru's patient, I am forwarding this to you. Ref #[id number].
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
## 2017-08-04 ENCOUNTER — Telehealth: Payer: Self-pay | Admitting: Cardiovascular Disease

## 2017-08-04 MED ORDER — ROSUVASTATIN CALCIUM 20 MG PO TABS: 20.0000 mg | ORAL_TABLET | Freq: Every day | ORAL | 3 refills | Status: DC

## 2017-08-04 NOTE — Telephone Encounter (Signed)
NEW MESSAGE     *STAT* If patient is at the pharmacy, call can be transferred to refill team.   1. Which medications need to be refilled? (please list name of each medication and dose if known) rosuvastatin (CRESTOR) 20 MG tablet  2. Which pharmacy/location (including street and city if local pharmacy) is medication to be sent to?[facility] [place], [place] - [phone]  3. Do they need a 30 day or 90 day supply? 90

## 2017-08-13 DIAGNOSIS — M109 Gout, unspecified: Secondary | ICD-10-CM | POA: Diagnosis not present

## 2017-08-28 DIAGNOSIS — R109 Unspecified abdominal pain: Secondary | ICD-10-CM | POA: Diagnosis not present

## 2017-08-28 DIAGNOSIS — K5900 Constipation, unspecified: Secondary | ICD-10-CM | POA: Diagnosis not present

## 2018-03-03 DIAGNOSIS — S61239A Puncture wound without foreign body of unspecified finger without damage to nail, initial encounter: Secondary | ICD-10-CM | POA: Diagnosis not present

## 2018-03-03 DIAGNOSIS — L03011 Cellulitis of right finger: Secondary | ICD-10-CM | POA: Diagnosis not present

## 2018-03-05 DIAGNOSIS — S6010XA Contusion of unspecified finger with damage to nail, initial encounter: Secondary | ICD-10-CM | POA: Diagnosis not present

## 2018-06-12 ENCOUNTER — Other Ambulatory Visit: Payer: Self-pay | Admitting: Cardiovascular Disease

## 2018-07-10 ENCOUNTER — Other Ambulatory Visit: Payer: Self-pay | Admitting: Cardiovascular Disease

## 2018-07-10 NOTE — Telephone Encounter (Signed)
° ° °*  STAT* If patient is at the pharmacy, call can be transferred to refill team.   1. Which medications need to be refilled? (please list name of each medication and dose if known) rosuvastatin (CRESTOR) 20 MG tablet  2. Which pharmacy/location (including street and city if local pharmacy) is medication to be sent to?[facility] - [place], [place] - [address]  3. Do they need a 30 day or 90 day supply? 90

## 2018-07-13 MED ORDER — ROSUVASTATIN CALCIUM 20 MG PO TABS: 20.0000 mg | ORAL_TABLET | Freq: Every day | ORAL | 0 refills | Status: DC

## 2018-07-13 NOTE — Telephone Encounter (Signed)
Rx request sent to pharmacy.  

## 2018-08-13 DIAGNOSIS — R05 Cough: Secondary | ICD-10-CM | POA: Diagnosis not present

## 2018-08-13 DIAGNOSIS — R0982 Postnasal drip: Secondary | ICD-10-CM | POA: Diagnosis not present

## 2018-08-13 DIAGNOSIS — J209 Acute bronchitis, unspecified: Secondary | ICD-10-CM | POA: Diagnosis not present

## 2018-08-13 DIAGNOSIS — R062 Wheezing: Secondary | ICD-10-CM | POA: Diagnosis not present

## 2018-08-20 DIAGNOSIS — R5383 Other fatigue: Secondary | ICD-10-CM | POA: Diagnosis not present

## 2018-08-20 DIAGNOSIS — R05 Cough: Secondary | ICD-10-CM | POA: Diagnosis not present

## 2018-08-20 DIAGNOSIS — J209 Acute bronchitis, unspecified: Secondary | ICD-10-CM | POA: Diagnosis not present

## 2018-08-20 DIAGNOSIS — Z1339 Encounter for screening examination for other mental health and behavioral disorders: Secondary | ICD-10-CM | POA: Diagnosis not present

## 2018-08-20 DIAGNOSIS — R0982 Postnasal drip: Secondary | ICD-10-CM | POA: Diagnosis not present

## 2018-08-20 DIAGNOSIS — R062 Wheezing: Secondary | ICD-10-CM | POA: Diagnosis not present

## 2018-08-20 DIAGNOSIS — Z125 Encounter for screening for malignant neoplasm of prostate: Secondary | ICD-10-CM | POA: Diagnosis not present

## 2018-08-20 DIAGNOSIS — R0602 Shortness of breath: Secondary | ICD-10-CM | POA: Diagnosis not present

## 2018-08-20 DIAGNOSIS — R030 Elevated blood-pressure reading, without diagnosis of hypertension: Secondary | ICD-10-CM | POA: Diagnosis not present

## 2018-08-20 DIAGNOSIS — Z0000 Encounter for general adult medical examination without abnormal findings: Secondary | ICD-10-CM | POA: Diagnosis not present

## 2018-08-20 DIAGNOSIS — E559 Vitamin D deficiency, unspecified: Secondary | ICD-10-CM | POA: Diagnosis not present

## 2018-09-02 DIAGNOSIS — J209 Acute bronchitis, unspecified: Secondary | ICD-10-CM | POA: Diagnosis not present

## 2018-09-02 DIAGNOSIS — R5383 Other fatigue: Secondary | ICD-10-CM | POA: Diagnosis not present

## 2018-09-02 DIAGNOSIS — E559 Vitamin D deficiency, unspecified: Secondary | ICD-10-CM | POA: Diagnosis not present

## 2018-09-02 DIAGNOSIS — E789 Disorder of lipoprotein metabolism, unspecified: Secondary | ICD-10-CM | POA: Diagnosis not present

## 2018-09-09 ENCOUNTER — Other Ambulatory Visit: Payer: Self-pay

## 2018-09-09 MED ORDER — AMLODIPINE BESYLATE 5 MG PO TABS: ORAL_TABLET | ORAL | 3 refills | Status: DC

## 2018-10-28 DIAGNOSIS — E789 Disorder of lipoprotein metabolism, unspecified: Secondary | ICD-10-CM | POA: Diagnosis not present

## 2018-10-28 DIAGNOSIS — N529 Male erectile dysfunction, unspecified: Secondary | ICD-10-CM | POA: Diagnosis not present

## 2018-10-28 DIAGNOSIS — Z113 Encounter for screening for infections with a predominantly sexual mode of transmission: Secondary | ICD-10-CM | POA: Diagnosis not present

## 2018-10-28 DIAGNOSIS — Z1159 Encounter for screening for other viral diseases: Secondary | ICD-10-CM | POA: Diagnosis not present

## 2018-10-28 DIAGNOSIS — E559 Vitamin D deficiency, unspecified: Secondary | ICD-10-CM | POA: Diagnosis not present

## 2018-10-28 DIAGNOSIS — R5383 Other fatigue: Secondary | ICD-10-CM | POA: Diagnosis not present

## 2018-11-05 DIAGNOSIS — Z136 Encounter for screening for cardiovascular disorders: Secondary | ICD-10-CM | POA: Diagnosis not present

## 2018-11-16 ENCOUNTER — Other Ambulatory Visit: Payer: Self-pay | Admitting: Cardiovascular Disease

## 2018-11-16 NOTE — Telephone Encounter (Signed)
New Message      *STAT* If patient is at the pharmacy, call can be transferred to refill team.   1. Which medications need to be refilled? (please list name of each medication and dose if known) Rosuvastatin   2. Which pharmacy/location (including street and city if local pharmacy) is medication to be sent to? [facility]   3. Do they need a 30 day or 90 day supply? 90 day

## 2018-11-17 MED ORDER — ROSUVASTATIN CALCIUM 20 MG PO TABS: 20.0000 mg | ORAL_TABLET | Freq: Every day | ORAL | 0 refills | Status: DC

## 2018-11-27 NOTE — Telephone Encounter (Signed)
Patient is out of medication. Please send refill request to pharmacy.

## 2018-11-30 MED ORDER — ROSUVASTATIN CALCIUM 20 MG PO TABS: 20.0000 mg | ORAL_TABLET | Freq: Every day | ORAL | 0 refills | Status: DC

## 2018-11-30 NOTE — Addendum Note (Signed)
Addended by: Waylan Rocher on: 11/30/2018 07:39 AM   Modules accepted: Orders

## 2018-11-30 NOTE — Telephone Encounter (Signed)
Message sent to scheduling for appt, 2 years since [facility] rx snet

## 2018-12-02 ENCOUNTER — Other Ambulatory Visit: Payer: Self-pay | Admitting: Cardiovascular Disease

## 2018-12-02 MED ORDER — ROSUVASTATIN CALCIUM 20 MG PO TABS: 20.0000 mg | ORAL_TABLET | Freq: Every day | ORAL | 0 refills | Status: DC

## 2018-12-02 NOTE — Telephone Encounter (Signed)
°*  STAT* If patient is at the pharmacy, call can be transferred to refill team.   1. Which medications need to be refilled? (please list name of each medication and dose if known)  rosuvastatin (CRESTOR) 20 MG tablet  2. Which pharmacy/location (including street and city if local pharmacy) is medication to be sent to?  [facility] ([place] - [place], [place]   3. Do they need a 30 day or 90 day supply? 30   Patient has not gotten his medication. He received a letter from [facility] stating they have no record of refilling his rx. He needs the medication sent ONLY to [facility]

## 2018-12-04 ENCOUNTER — Telehealth: Payer: Self-pay | Admitting: Cardiovascular Disease

## 2018-12-04 NOTE — Telephone Encounter (Signed)
LVM for patient to call and schedule appt with Dr. Sallyanne Kuster on 12-17-18.

## 2018-12-21 ENCOUNTER — Telehealth: Payer: Self-pay | Admitting: Cardiovascular Disease

## 2018-12-21 NOTE — Telephone Encounter (Signed)
I called pt to remind pt of his appt with Dr C on 12-22-18.        COVID-19 Pre-Screening Questions:   In the past 7 to 10 days have you had a cough,  shortness of breath, headache, congestion, fever (100 or greater) body aches, chills, sore throat, or sudden loss of taste or sense of smell? no  Have you been around anyone with known Covid 19.  Have you been around anyone who is awaiting Covid 19 test results in the past 7 to 10 days? no Have you been around anyone who has been exposed to Covid 19, or has mentioned symptoms of Covid 19 within the past 7 to 10 days?  no If you have any concerns/questions about symptoms patients report during screening (either on the phone or at [facility]). Contact the provider seeing the patient or DOD for further guidance.  If neither are available contact a member of the leadership team.

## 2018-12-22 ENCOUNTER — Other Ambulatory Visit: Payer: Self-pay

## 2018-12-22 ENCOUNTER — Encounter: Payer: Self-pay | Admitting: Cardiovascular Disease

## 2018-12-22 ENCOUNTER — Ambulatory Visit (INDEPENDENT_AMBULATORY_CARE_PROVIDER_SITE_OTHER): Payer: PPO | Admitting: Cardiovascular Disease

## 2018-12-22 VITALS — BP 120/64 | HR 68 | Temp 97.3°F | Ht 73.0 in | Wt 291.2 lb

## 2018-12-22 DIAGNOSIS — E782 Mixed hyperlipidemia: Secondary | ICD-10-CM

## 2018-12-22 DIAGNOSIS — N529 Male erectile dysfunction, unspecified: Secondary | ICD-10-CM

## 2018-12-22 DIAGNOSIS — I25118 Atherosclerotic heart disease of native coronary artery with other forms of angina pectoris: Secondary | ICD-10-CM

## 2018-12-22 DIAGNOSIS — I10 Essential (primary) hypertension: Secondary | ICD-10-CM | POA: Diagnosis not present

## 2018-12-22 DIAGNOSIS — I358 Other nonrheumatic aortic valve disorders: Secondary | ICD-10-CM | POA: Diagnosis not present
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
## 2018-12-22 NOTE — Patient Instructions (Addendum)
Follow-Up: You will need a follow up appointment in 12 months. Please call our office 2 months in advance, May 2021 to schedule this, July 2021 appointment.   You may see Sanda Klein, MD or one of the following Advanced Practice Providers on your designated Care Team:  Almyra Deforest, PA-C  Fabian Sharp, [place]       Medication Instructions:  The current medical regimen is effective;  continue present plan and medications as directed. Please refer to the Current Medication list given to you today. If you need a refill on your cardiac medications before your next appointment, please call your pharmacy. Labwork: When you have labs (blood work) and your tests are completely normal, you will receive your results ONLY by [place] (if you have MyChart) -OR- A paper copy in the mail.  At [facility], you and your health needs are our priority.  As part of our continuing mission to provide you with exceptional heart care, we have created designated Provider Care Teams.  These Care Teams include your primary Cardiologist (physician) and Advanced Practice Providers (APPs -  Physician Assistants and Nurse Practitioners) who all work together to provide you with the care you need, when you need it.  Thank you for choosing [facility] at [facility]!!

## 2018-12-30 ENCOUNTER — Ambulatory Visit: Payer: PPO | Admitting: Cardiovascular Disease

## 2019-01-27 DIAGNOSIS — E559 Vitamin D deficiency, unspecified: Secondary | ICD-10-CM | POA: Diagnosis not present

## 2019-01-27 DIAGNOSIS — N529 Male erectile dysfunction, unspecified: Secondary | ICD-10-CM | POA: Diagnosis not present

## 2019-01-27 DIAGNOSIS — R5383 Other fatigue: Secondary | ICD-10-CM | POA: Diagnosis not present

## 2019-01-27 DIAGNOSIS — Z1159 Encounter for screening for other viral diseases: Secondary | ICD-10-CM | POA: Diagnosis not present

## 2019-01-27 DIAGNOSIS — E789 Disorder of lipoprotein metabolism, unspecified: Secondary | ICD-10-CM | POA: Diagnosis not present

## 2019-02-09 ENCOUNTER — Other Ambulatory Visit: Payer: Self-pay

## 2019-02-09 MED ORDER — ROSUVASTATIN CALCIUM 20 MG PO TABS: 20.0000 mg | ORAL_TABLET | Freq: Every day | ORAL | 1 refills | Status: DC

## 2019-04-28 DIAGNOSIS — R5383 Other fatigue: Secondary | ICD-10-CM | POA: Diagnosis not present

## 2019-04-28 DIAGNOSIS — E559 Vitamin D deficiency, unspecified: Secondary | ICD-10-CM | POA: Diagnosis not present

## 2019-04-28 DIAGNOSIS — Z1159 Encounter for screening for other viral diseases: Secondary | ICD-10-CM | POA: Diagnosis not present

## 2019-04-28 DIAGNOSIS — N529 Male erectile dysfunction, unspecified: Secondary | ICD-10-CM | POA: Diagnosis not present

## 2019-04-28 DIAGNOSIS — E789 Disorder of lipoprotein metabolism, unspecified: Secondary | ICD-10-CM | POA: Diagnosis not present

## 2019-06-22 DIAGNOSIS — Z20828 Contact with and (suspected) exposure to other viral communicable diseases: Secondary | ICD-10-CM | POA: Diagnosis not present

## 2019-06-25 ENCOUNTER — Other Ambulatory Visit: Payer: Self-pay

## 2019-06-25 MED ORDER — ROSUVASTATIN CALCIUM 20 MG PO TABS: 20.0000 mg | ORAL_TABLET | Freq: Every day | ORAL | 0 refills | Status: DC

## 2019-08-25 DIAGNOSIS — E789 Disorder of lipoprotein metabolism, unspecified: Secondary | ICD-10-CM | POA: Diagnosis not present

## 2019-08-25 DIAGNOSIS — N529 Male erectile dysfunction, unspecified: Secondary | ICD-10-CM | POA: Diagnosis not present

## 2019-08-25 DIAGNOSIS — Z0000 Encounter for general adult medical examination without abnormal findings: Secondary | ICD-10-CM | POA: Diagnosis not present

## 2019-08-25 DIAGNOSIS — R5383 Other fatigue: Secondary | ICD-10-CM | POA: Diagnosis not present

## 2019-08-25 DIAGNOSIS — Z125 Encounter for screening for malignant neoplasm of prostate: Secondary | ICD-10-CM | POA: Diagnosis not present

## 2019-08-25 DIAGNOSIS — E559 Vitamin D deficiency, unspecified: Secondary | ICD-10-CM | POA: Diagnosis not present

## 2019-08-25 DIAGNOSIS — R0602 Shortness of breath: Secondary | ICD-10-CM | POA: Diagnosis not present

## 2019-08-25 DIAGNOSIS — Z1159 Encounter for screening for other viral diseases: Secondary | ICD-10-CM | POA: Diagnosis not present

## 2019-08-26 DIAGNOSIS — Z125 Encounter for screening for malignant neoplasm of prostate: Secondary | ICD-10-CM | POA: Diagnosis not present

## 2019-08-26 DIAGNOSIS — E559 Vitamin D deficiency, unspecified: Secondary | ICD-10-CM | POA: Diagnosis not present

## 2019-08-26 DIAGNOSIS — Z1159 Encounter for screening for other viral diseases: Secondary | ICD-10-CM | POA: Diagnosis not present

## 2019-08-26 DIAGNOSIS — R5383 Other fatigue: Secondary | ICD-10-CM | POA: Diagnosis not present

## 2019-08-26 DIAGNOSIS — E789 Disorder of lipoprotein metabolism, unspecified: Secondary | ICD-10-CM | POA: Diagnosis not present

## 2019-09-02 DIAGNOSIS — R0602 Shortness of breath: Secondary | ICD-10-CM | POA: Diagnosis not present

## 2019-09-02 DIAGNOSIS — E669 Obesity, unspecified: Secondary | ICD-10-CM | POA: Diagnosis not present

## 2019-09-02 DIAGNOSIS — I10 Essential (primary) hypertension: Secondary | ICD-10-CM | POA: Diagnosis not present

## 2019-09-02 DIAGNOSIS — I252 Old myocardial infarction: Secondary | ICD-10-CM | POA: Diagnosis not present

## 2019-09-02 DIAGNOSIS — Z0000 Encounter for general adult medical examination without abnormal findings: Secondary | ICD-10-CM | POA: Diagnosis not present

## 2019-09-06 DIAGNOSIS — Z122 Encounter for screening for malignant neoplasm of respiratory organs: Secondary | ICD-10-CM | POA: Diagnosis not present

## 2019-09-09 DIAGNOSIS — E559 Vitamin D deficiency, unspecified: Secondary | ICD-10-CM | POA: Diagnosis not present

## 2019-09-09 DIAGNOSIS — E789 Disorder of lipoprotein metabolism, unspecified: Secondary | ICD-10-CM | POA: Diagnosis not present

## 2019-09-09 DIAGNOSIS — R5383 Other fatigue: Secondary | ICD-10-CM | POA: Diagnosis not present

## 2019-09-09 DIAGNOSIS — N529 Male erectile dysfunction, unspecified: Secondary | ICD-10-CM | POA: Diagnosis not present

## 2019-09-13 DIAGNOSIS — E789 Disorder of lipoprotein metabolism, unspecified: Secondary | ICD-10-CM | POA: Diagnosis not present

## 2019-09-13 DIAGNOSIS — M79671 Pain in right foot: Secondary | ICD-10-CM | POA: Diagnosis not present

## 2019-09-13 DIAGNOSIS — Z1159 Encounter for screening for other viral diseases: Secondary | ICD-10-CM | POA: Diagnosis not present

## 2019-09-13 DIAGNOSIS — E559 Vitamin D deficiency, unspecified: Secondary | ICD-10-CM | POA: Diagnosis not present

## 2019-09-13 DIAGNOSIS — N529 Male erectile dysfunction, unspecified: Secondary | ICD-10-CM | POA: Diagnosis not present

## 2019-09-13 DIAGNOSIS — M79674 Pain in right toe(s): Secondary | ICD-10-CM | POA: Diagnosis not present

## 2019-09-29 DIAGNOSIS — M79674 Pain in right toe(s): Secondary | ICD-10-CM | POA: Diagnosis not present

## 2019-10-25 ENCOUNTER — Other Ambulatory Visit: Payer: Self-pay | Admitting: Cardiovascular Disease

## 2019-10-25 NOTE — Telephone Encounter (Signed)
*  STAT* If patient is at the pharmacy, call can be transferred to refill team.   1. Which medications need to be refilled? (please list name of each medication and dose if known) rosuvastatin (CRESTOR) 20 MG tablet  2. Which pharmacy/location (including street and city if local pharmacy) is medication to be sent to? [facility] - [place], [place] - [address]  3. Do they need a 30 day or 90 day supply? 90

## 2019-10-26 MED ORDER — ROSUVASTATIN CALCIUM 20 MG PO TABS: 20.0000 mg | ORAL_TABLET | Freq: Every day | ORAL | 0 refills | Status: DC

## 2019-11-02 ENCOUNTER — Other Ambulatory Visit: Payer: Self-pay

## 2019-11-18 ENCOUNTER — Other Ambulatory Visit: Payer: Self-pay

## 2019-11-18 MED ORDER — AMLODIPINE BESYLATE 5 MG PO TABS: ORAL_TABLET | ORAL | 3 refills | Status: DC

## 2019-12-22 ENCOUNTER — Other Ambulatory Visit: Payer: Self-pay | Admitting: Cardiovascular Disease

## 2019-12-22 MED ORDER — AMLODIPINE BESYLATE 5 MG PO TABS: ORAL_TABLET | ORAL | 0 refills | Status: DC

## 2019-12-22 NOTE — Telephone Encounter (Signed)
Rx(s) sent to pharmacy electronically.  

## 2019-12-22 NOTE — Telephone Encounter (Signed)
*  STAT* If patient is at the pharmacy, call can be transferred to refill team.   1. Which medications need to be refilled? (please list name of each medication and dose if known)? amLODipine (NORVASC) 5 MG tablet  2. Which pharmacy/location (including street and city if local pharmacy) is medication to be sent to? [facility] [address] - [address] [place], [place], [place] [address]  3. Do they need a 30 day or 90 day supply? 90 day    Patient requested to add this pharmacy to his list.  [facility] on [address] [place], [place], [place] [address] [phone]

## 2019-12-28 ENCOUNTER — Ambulatory Visit: Payer: PPO | Admitting: Cardiovascular Disease

## 2019-12-30 ENCOUNTER — Ambulatory Visit: Payer: PPO | Admitting: Cardiovascular Disease

## 2019-12-30 ENCOUNTER — Encounter: Payer: Self-pay | Admitting: Cardiovascular Disease

## 2019-12-30 ENCOUNTER — Other Ambulatory Visit: Payer: Self-pay

## 2019-12-30 VITALS — BP 149/74 | HR 68 | Ht 73.0 in | Wt 283.6 lb

## 2019-12-30 DIAGNOSIS — I25118 Atherosclerotic heart disease of native coronary artery with other forms of angina pectoris: Secondary | ICD-10-CM

## 2019-12-30 DIAGNOSIS — I358 Other nonrheumatic aortic valve disorders: Secondary | ICD-10-CM | POA: Diagnosis not present

## 2019-12-30 DIAGNOSIS — N529 Male erectile dysfunction, unspecified: Secondary | ICD-10-CM

## 2019-12-30 DIAGNOSIS — E782 Mixed hyperlipidemia: Secondary | ICD-10-CM | POA: Diagnosis not present

## 2019-12-30 DIAGNOSIS — I10 Essential (primary) hypertension: Secondary | ICD-10-CM | POA: Diagnosis not present

## 2019-12-30 LAB — COMPREHENSIVE METABOLIC PANEL
ALT: 23 IU/L (ref 0–44)
AST: 19 IU/L (ref 0–40)
Albumin/Globulin Ratio: 2 (ref 1.2–2.2)
Albumin: 4.7 g/dL (ref 3.7–4.7)
Alkaline Phosphatase: 68 IU/L (ref 48–121)
BUN/Creatinine Ratio: 21 (ref 10–24)
BUN: 21 mg/dL (ref 8–27)
Bilirubin Total: 0.7 mg/dL (ref 0.0–1.2)
CO2: 24 mmol/L (ref 20–29)
Calcium: 9.5 mg/dL (ref 8.6–10.2)
Chloride: 103 mmol/L (ref 96–106)
Creatinine, Ser: 0.98 mg/dL (ref 0.76–1.27)
GFR calc Af Amer: 87 mL/min/{1.73_m2} (ref >59)
GFR calc non Af Amer: 75 mL/min/{1.73_m2} (ref >59)
Globulin, Total: 2.3 g/dL (ref 1.5–4.5)
Glucose: 107 mg/dL — ABNORMAL HIGH (ref 65–99)
Potassium: 4.8 mmol/L (ref 3.5–5.2)
Sodium: 143 mmol/L (ref 134–144)
Total Protein: 7 g/dL (ref 6.0–8.5)

## 2019-12-30 LAB — LIPID PANEL
Chol/HDL Ratio: 4.5 ratio (ref 0.0–5.0)
Cholesterol, Total: 121 mg/dL (ref 100–199)
HDL: 27 mg/dL — ABNORMAL LOW (ref >39)
LDL Chol Calc (NIH): 58 mg/dL (ref 0–99)
Triglycerides: 217 mg/dL — ABNORMAL HIGH (ref 0–149)
VLDL Cholesterol Cal: 36 mg/dL (ref 5–40)
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
## 2019-12-30 NOTE — Patient Instructions (Addendum)
Medication Instructions:  No changes *If you need a refill on your cardiac medications before your next appointment, please call your pharmacy*   Lab Work: Your provider would like for you to have the following labs today: Lipid and CMET  If you have labs (blood work) drawn today and your tests are completely normal, you will receive your results only by: Marland Kitchen MyChart Message (if you have MyChart) OR . A paper copy in the mail If you have any lab test that is abnormal or we need to change your treatment, we will call you to review the results.   Testing/Procedures: None ordered   Follow-Up: At [facility], you and your health needs are our priority.  As part of our continuing mission to provide you with exceptional heart care, we have created designated Provider Care Teams.  These Care Teams include your primary Cardiologist (physician) and Advanced Practice Providers (APPs -  Physician Assistants and Nurse Practitioners) who all work together to provide you with the care you need, when you need it.  We recommend signing up for the patient portal called "MyChart".  Sign up information is provided on this After Visit Summary.  MyChart is used to connect with patients for Virtual Visits (Telemedicine).  Patients are able to view lab/test results, encounter notes, upcoming appointments, etc.  Non-urgent messages can be sent to your provider as well.   To learn more about what you can do with MyChart, go to [email or web address].    Your next appointment:   12 month(s)  The format for your next appointment:   In Person  Provider:   You may see Thurmon Fair, MD or one of the following Advanced Practice Providers on your designated Care Team:    Azalee Course, PA-C  Micah Flesher, [place] or   Judy Pimple, [place]  Dr. Royann Shivers would like you to check your blood pressure daily for the next 2 weeks.  Keep a journal of these daily blood pressure and heart rate readings and call our office or  send a message through MyChart with the results. Thank you!

## 2019-12-31 ENCOUNTER — Encounter: Payer: Self-pay | Admitting: *Deleted

## 2020-01-02 ENCOUNTER — Encounter: Payer: Self-pay | Admitting: Cardiovascular Disease

## 2020-01-12 DIAGNOSIS — I10 Essential (primary) hypertension: Secondary | ICD-10-CM | POA: Diagnosis not present

## 2020-01-12 DIAGNOSIS — N529 Male erectile dysfunction, unspecified: Secondary | ICD-10-CM | POA: Diagnosis not present

## 2020-01-12 DIAGNOSIS — E559 Vitamin D deficiency, unspecified: Secondary | ICD-10-CM | POA: Diagnosis not present

## 2020-01-12 DIAGNOSIS — Z1159 Encounter for screening for other viral diseases: Secondary | ICD-10-CM | POA: Diagnosis not present

## 2020-01-12 DIAGNOSIS — E789 Disorder of lipoprotein metabolism, unspecified: Secondary | ICD-10-CM | POA: Diagnosis not present

## 2020-03-30 ENCOUNTER — Other Ambulatory Visit: Payer: Self-pay | Admitting: Cardiovascular Disease

## 2020-07-11 ENCOUNTER — Telehealth: Payer: Self-pay | Admitting: Cardiovascular Disease

## 2020-07-11 MED ORDER — ROSUVASTATIN CALCIUM 20 MG PO TABS: 20.0000 mg | ORAL_TABLET | Freq: Every day | ORAL | 1 refills | Status: DC

## 2020-07-11 NOTE — Telephone Encounter (Signed)
*  STAT* If patient is at the pharmacy, call can be transferred to refill team.   1. Which medications need to be refilled? (please list name of each medication and dose if known) rosuvastatin (CRESTOR) 20 MG tablet    2. Which pharmacy/location (including street and city if local pharmacy) is medication to be sent to? [facility] ([place]) - [place], [place] - [address]  3. Do they need a 30 day or 90 day supply? 90  Patient states that he is almost out of this medication. He is not due to see Dr. Royann Shivers again until after 12/2020.  Please advise.

## 2020-07-11 NOTE — Telephone Encounter (Signed)
6 month refill has been sent in for the patient.

## 2020-07-14 DIAGNOSIS — M79672 Pain in left foot: Secondary | ICD-10-CM | POA: Diagnosis not present

## 2020-07-21 ENCOUNTER — Other Ambulatory Visit: Payer: Self-pay

## 2020-07-21 ENCOUNTER — Ambulatory Visit (INDEPENDENT_AMBULATORY_CARE_PROVIDER_SITE_OTHER): Payer: PPO | Admitting: Podiatry

## 2020-07-21 ENCOUNTER — Other Ambulatory Visit: Payer: Self-pay | Admitting: Podiatry

## 2020-07-21 ENCOUNTER — Ambulatory Visit (INDEPENDENT_AMBULATORY_CARE_PROVIDER_SITE_OTHER): Payer: PPO

## 2020-07-21 DIAGNOSIS — M722 Plantar fascial fibromatosis: Secondary | ICD-10-CM | POA: Diagnosis not present

## 2020-07-21 DIAGNOSIS — M79672 Pain in left foot: Secondary | ICD-10-CM

## 2020-07-25 ENCOUNTER — Encounter: Payer: Self-pay | Admitting: Podiatry

## 2020-07-25 NOTE — Progress Notes (Signed)
Subjective:  Patient ID: Jesus [name], male    DOB: 01-19-1946,  MRN: [id number]  Chief Complaint  Patient presents with  . Foot Pain    Heel pain Pt stated that he has heel pain that is constant and feels like a toothache.     75 y.o. male presents with the above complaint.  Patient presents with complaint of left heel pain patient states he was at the primary care doctor office where they did an x-ray about 2 weeks ago.  However not able to see the x-ray in our office will need to be attempted.  Patient states the pain is constant feels like a toothache.  Hurts with taking the first up in the morning.  Patient is not seen anyone else prior to seeing me.  He has not tried any treatment options.  He denies any other acute complaints.    Review of Systems: Negative except as noted in the HPI. Denies N/V/F/Ch.  Past Medical History:  Diagnosis Date  . CAD (coronary artery disease)   . Dyslipidemia   . ED (erectile dysfunction)   . NSTEMI (non-ST elevated myocardial infarction) (HCC) 10/12/07  . Obesity   . Systemic hypertension     Current Outpatient Medications:  .  amLODipine (NORVASC) 5 MG tablet, TAKE 1 TABLET(5 MG) BY MOUTH DAILY, Disp: 90 tablet, Rfl: 3 .  aspirin 81 MG tablet, Take 81 mg by mouth daily., Disp: , Rfl:  .  rosuvastatin (CRESTOR) 20 MG tablet, Take 1 tablet (20 mg total) by mouth daily., Disp: 90 tablet, Rfl: 1 .  sildenafil (VIAGRA) 50 MG tablet, Take 1 tablet (50 mg total) by mouth daily as needed for erectile dysfunction., Disp: 10 tablet, Rfl: 0 .  Vitamin D, Ergocalciferol, 50 MCG (2000 UT) CAPS, Take 1 tablet by mouth daily., Disp: , Rfl:   Social History   Tobacco Use  Smoking Status Current Some Day Smoker  . Types: Cigars  Smokeless Tobacco Never Used    Allergies  Allergen Reactions  . Ace Inhibitors Swelling    Angioedema 08/11/13 to lisinopril  . Lisinopril Swelling   Objective:  There were no vitals filed for this visit. There is no height  or weight on file to calculate BMI. Constitutional Well developed. Well nourished.  Vascular Dorsalis pedis pulses palpable bilaterally. Posterior tibial pulses palpable bilaterally. Capillary refill normal to all digits.  No cyanosis or clubbing noted. Pedal hair growth normal.  Neurologic Normal speech. Oriented to person, place, and time. Epicritic sensation to light touch grossly present bilaterally.  Dermatologic Nails well groomed and normal in appearance. No open wounds. No skin lesions.  Orthopedic: Normal joint ROM without pain or crepitus bilaterally. No visible deformities. Tender to palpation at the calcaneal tuber left. No pain with calcaneal squeeze left. Ankle ROM diminished range of motion left. Silfverskiold Test: positive left.   Radiographs: Taken and reviewed. No acute fractures or dislocations. No evidence of stress fracture.  Plantar heel spur absent. Posterior heel spur present.  Previous hardware noted.  Appears to be intact.  Assessment:   1. Plantar fasciitis, left    Plan:  Patient was evaluated and treated and all questions answered.  Plantar Fasciitis, left - XR reviewed as above.  - Educated on icing and stretching. Instructions given.  - Injection delivered to the plantar fascia as below. - DME: Plantar Fascial Brace - Pharmacologic management: None  Procedure: Injection Tendon/Ligament Location: Left plantar fascia at the glabrous junction; medial approach. Skin Prep: alcohol  Injectate: 0.5 cc 0.5% marcaine plain, 0.5 cc of 1% Lidocaine, 0.5 cc kenalog 10. Disposition: Patient tolerated procedure well. Injection site dressed with a band-aid.  No follow-ups on file.

## 2020-08-23 ENCOUNTER — Ambulatory Visit (INDEPENDENT_AMBULATORY_CARE_PROVIDER_SITE_OTHER): Payer: PPO | Admitting: Podiatry

## 2020-08-23 ENCOUNTER — Other Ambulatory Visit: Payer: Self-pay

## 2020-08-23 DIAGNOSIS — M216X9 Other acquired deformities of unspecified foot: Secondary | ICD-10-CM | POA: Diagnosis not present

## 2020-08-23 DIAGNOSIS — M722 Plantar fascial fibromatosis: Secondary | ICD-10-CM | POA: Diagnosis not present

## 2020-08-29 ENCOUNTER — Encounter: Payer: Self-pay | Admitting: Podiatry

## 2020-08-29 ENCOUNTER — Ambulatory Visit (INDEPENDENT_AMBULATORY_CARE_PROVIDER_SITE_OTHER): Payer: PPO | Admitting: *Deleted

## 2020-08-29 ENCOUNTER — Other Ambulatory Visit: Payer: Self-pay

## 2020-08-29 DIAGNOSIS — Z125 Encounter for screening for malignant neoplasm of prostate: Secondary | ICD-10-CM | POA: Diagnosis not present

## 2020-08-29 DIAGNOSIS — I10 Essential (primary) hypertension: Secondary | ICD-10-CM | POA: Diagnosis not present

## 2020-08-29 DIAGNOSIS — M722 Plantar fascial fibromatosis: Secondary | ICD-10-CM

## 2020-08-29 DIAGNOSIS — Z0000 Encounter for general adult medical examination without abnormal findings: Secondary | ICD-10-CM | POA: Diagnosis not present

## 2020-08-29 DIAGNOSIS — M79672 Pain in left foot: Secondary | ICD-10-CM | POA: Diagnosis not present

## 2020-08-29 DIAGNOSIS — E559 Vitamin D deficiency, unspecified: Secondary | ICD-10-CM | POA: Diagnosis not present

## 2020-08-29 DIAGNOSIS — Z1159 Encounter for screening for other viral diseases: Secondary | ICD-10-CM | POA: Diagnosis not present

## 2020-08-29 DIAGNOSIS — R5383 Other fatigue: Secondary | ICD-10-CM | POA: Diagnosis not present

## 2020-08-29 DIAGNOSIS — Z1339 Encounter for screening examination for other mental health and behavioral disorders: Secondary | ICD-10-CM | POA: Diagnosis not present

## 2020-08-29 DIAGNOSIS — R0602 Shortness of breath: Secondary | ICD-10-CM | POA: Diagnosis not present

## 2020-08-29 DIAGNOSIS — M216X9 Other acquired deformities of unspecified foot: Secondary | ICD-10-CM

## 2020-08-29 DIAGNOSIS — Z6834 Body mass index (BMI) 34.0-34.9, adult: Secondary | ICD-10-CM | POA: Diagnosis not present

## 2020-08-29 DIAGNOSIS — E789 Disorder of lipoprotein metabolism, unspecified: Secondary | ICD-10-CM | POA: Diagnosis not present

## 2020-08-29 DIAGNOSIS — Z131 Encounter for screening for diabetes mellitus: Secondary | ICD-10-CM | POA: Diagnosis not present

## 2020-08-29 DIAGNOSIS — Z113 Encounter for screening for infections with a predominantly sexual mode of transmission: Secondary | ICD-10-CM | POA: Diagnosis not present

## 2020-08-29 DIAGNOSIS — Z122 Encounter for screening for malignant neoplasm of respiratory organs: Secondary | ICD-10-CM | POA: Diagnosis not present

## 2020-08-29 DIAGNOSIS — N529 Male erectile dysfunction, unspecified: Secondary | ICD-10-CM | POA: Diagnosis not present

## 2020-08-29 NOTE — Progress Notes (Signed)
Subjective:  Patient ID: Jesus [name], male    DOB: 01/15/1946,  MRN: [id number]  Chief Complaint  Patient presents with  . Plantar Fasciitis    PT stated that he still has pain when walking     75 y.o. male presents with the above complaint.  Patient presents with a follow-up of left heel pain.  Patient states is doing a little bit better.  He still aches in the foot the injection helped considerably.  At this time he would like to discuss noninjection treatment options and orthotics and shoe gear modification.  He denies any other acute complaints.    Review of Systems: Negative except as noted in the HPI. Denies N/V/F/Ch.  Past Medical History:  Diagnosis Date  . CAD (coronary artery disease)   . Dyslipidemia   . ED (erectile dysfunction)   . NSTEMI (non-ST elevated myocardial infarction) (HCC) 10/12/07  . Obesity   . Systemic hypertension     Current Outpatient Medications:  .  amLODipine (NORVASC) 5 MG tablet, TAKE 1 TABLET(5 MG) BY MOUTH DAILY, Disp: 90 tablet, Rfl: 3 .  aspirin 81 MG tablet, Take 81 mg by mouth daily., Disp: , Rfl:  .  rosuvastatin (CRESTOR) 20 MG tablet, Take 1 tablet (20 mg total) by mouth daily., Disp: 90 tablet, Rfl: 1 .  sildenafil (VIAGRA) 50 MG tablet, Take 1 tablet (50 mg total) by mouth daily as needed for erectile dysfunction., Disp: 10 tablet, Rfl: 0 .  Vitamin D, Ergocalciferol, 50 MCG (2000 UT) CAPS, Take 1 tablet by mouth daily., Disp: , Rfl:   Social History   Tobacco Use  Smoking Status Current Some Day Smoker  . Types: Cigars  Smokeless Tobacco Never Used    Allergies  Allergen Reactions  . Ace Inhibitors Swelling    Angioedema 08/11/13 to lisinopril  . Lisinopril Swelling   Objective:  There were no vitals filed for this visit. There is no height or weight on file to calculate BMI. Constitutional Well developed. Well nourished.  Vascular Dorsalis pedis pulses palpable bilaterally. Posterior tibial pulses palpable  bilaterally. Capillary refill normal to all digits.  No cyanosis or clubbing noted. Pedal hair growth normal.  Neurologic Normal speech. Oriented to person, place, and time. Epicritic sensation to light touch grossly present bilaterally.  Dermatologic Nails well groomed and normal in appearance. No open wounds. No skin lesions.  Orthopedic: Normal joint ROM without pain or crepitus bilaterally. No visible deformities. Tender to palpation at the calcaneal tuber left. No pain with calcaneal squeeze left. Ankle ROM diminished range of motion left. Silfverskiold Test: positive left.   Radiographs: Taken and reviewed. No acute fractures or dislocations. No evidence of stress fracture.  Plantar heel spur absent. Posterior heel spur present.  Previous hardware noted.  Appears to be intact.  Assessment:   1. Plantar fasciitis, left   2. Cavus foot, acquired    Plan:  Patient was evaluated and treated and all questions answered.  Plantar Fasciitis, left - XR reviewed as above.  - Educated on icing and stretching. Instructions given.  - Injection delivered to the plantar fascia as below. - DME: Night splint - Pharmacologic management: None  Semiflexible cavus foot structure -I explained the patient the etiology of cavus foot structure various treatment options were discussed.  Given that his high arch foot structure is putting a lot of stress on the plantar fascia leading to pain I believe patient will benefit from orthotics to support the arches of the foot and  take the stress of the plantar fascia.  Patient agrees with the plan. -He will be scheduled to be seen for orthotics.  No follow-ups on file.

## 2020-08-29 NOTE — Progress Notes (Signed)
Patient presents today to be casted for custom molded orthotics. Dr. Allena Katz has been treating patient for plantar fasciitis and cavus foot deformity.   Impression foam cast was taken. ABN signed.  Patient info-  Shoe size: 11.5 (4 wide)  Shoe style: Sneakers  Height: 6'1  Weight: 270   Patient will be notified once orthotics arrive in office and reappoint for fitting at that time.

## 2020-09-12 DIAGNOSIS — Z136 Encounter for screening for cardiovascular disorders: Secondary | ICD-10-CM | POA: Diagnosis not present

## 2020-09-12 DIAGNOSIS — Z87891 Personal history of nicotine dependence: Secondary | ICD-10-CM | POA: Diagnosis not present

## 2020-09-19 DIAGNOSIS — E559 Vitamin D deficiency, unspecified: Secondary | ICD-10-CM | POA: Diagnosis not present

## 2020-09-19 DIAGNOSIS — M79672 Pain in left foot: Secondary | ICD-10-CM | POA: Diagnosis not present

## 2020-09-19 DIAGNOSIS — E789 Disorder of lipoprotein metabolism, unspecified: Secondary | ICD-10-CM | POA: Diagnosis not present

## 2020-09-19 DIAGNOSIS — I10 Essential (primary) hypertension: Secondary | ICD-10-CM | POA: Diagnosis not present

## 2020-09-19 DIAGNOSIS — Z6834 Body mass index (BMI) 34.0-34.9, adult: Secondary | ICD-10-CM | POA: Diagnosis not present

## 2020-09-25 ENCOUNTER — Ambulatory Visit (INDEPENDENT_AMBULATORY_CARE_PROVIDER_SITE_OTHER): Payer: PPO | Admitting: Podiatry

## 2020-09-25 ENCOUNTER — Other Ambulatory Visit: Payer: Self-pay

## 2020-09-25 DIAGNOSIS — M722 Plantar fascial fibromatosis: Secondary | ICD-10-CM

## 2020-09-25 DIAGNOSIS — M216X9 Other acquired deformities of unspecified foot: Secondary | ICD-10-CM

## 2020-09-25 NOTE — Patient Instructions (Signed)

## 2020-09-25 NOTE — Progress Notes (Signed)
Patient presents today for orthotic pick up. Patient voices no new complaints.  Orthotics were fitted to patient's feet. No discomfort and no rubbing. Patient satisfied with the orthotics.  Orthotics were dispensed to patient with instructions for break in wear and to call the office with any concerns or questions. 

## 2020-10-04 ENCOUNTER — Ambulatory Visit: Payer: PPO | Admitting: Podiatry

## 2020-11-20 ENCOUNTER — Other Ambulatory Visit: Payer: Self-pay

## 2020-11-20 MED ORDER — ROSUVASTATIN CALCIUM 20 MG PO TABS: 20.0000 mg | ORAL_TABLET | Freq: Every day | ORAL | 0 refills | Status: DC

## 2021-03-20 DIAGNOSIS — E789 Disorder of lipoprotein metabolism, unspecified: Secondary | ICD-10-CM | POA: Diagnosis not present

## 2021-03-20 DIAGNOSIS — E559 Vitamin D deficiency, unspecified: Secondary | ICD-10-CM | POA: Diagnosis not present

## 2021-03-20 DIAGNOSIS — N529 Male erectile dysfunction, unspecified: Secondary | ICD-10-CM | POA: Diagnosis not present

## 2021-03-20 DIAGNOSIS — I10 Essential (primary) hypertension: Secondary | ICD-10-CM | POA: Diagnosis not present

## 2021-03-20 DIAGNOSIS — Z6834 Body mass index (BMI) 34.0-34.9, adult: Secondary | ICD-10-CM | POA: Diagnosis not present

## 2021-03-20 DIAGNOSIS — Z1159 Encounter for screening for other viral diseases: Secondary | ICD-10-CM | POA: Diagnosis not present

## 2021-04-13 ENCOUNTER — Other Ambulatory Visit: Payer: Self-pay | Admitting: Cardiovascular Disease

## 2021-05-01 ENCOUNTER — Ambulatory Visit: Payer: PPO | Admitting: Cardiovascular Disease

## 2021-06-22 ENCOUNTER — Encounter: Payer: Self-pay | Admitting: Cardiovascular Disease

## 2021-06-22 ENCOUNTER — Ambulatory Visit: Payer: PPO | Admitting: Cardiovascular Disease

## 2021-06-22 ENCOUNTER — Other Ambulatory Visit: Payer: Self-pay

## 2021-06-22 VITALS — BP 118/66 | HR 69 | Ht 73.0 in | Wt 275.0 lb

## 2021-06-22 DIAGNOSIS — E785 Hyperlipidemia, unspecified: Secondary | ICD-10-CM | POA: Diagnosis not present

## 2021-06-22 DIAGNOSIS — I10 Essential (primary) hypertension: Secondary | ICD-10-CM

## 2021-06-22 DIAGNOSIS — I358 Other nonrheumatic aortic valve disorders: Secondary | ICD-10-CM | POA: Diagnosis not present

## 2021-06-22 DIAGNOSIS — Z6836 Body mass index (BMI) 36.0-36.9, adult: Secondary | ICD-10-CM

## 2021-06-22 DIAGNOSIS — N522 Drug-induced erectile dysfunction: Secondary | ICD-10-CM

## 2021-06-22 DIAGNOSIS — I25118 Atherosclerotic heart disease of native coronary artery with other forms of angina pectoris: Secondary | ICD-10-CM | POA: Diagnosis not present

## 2021-06-22 NOTE — Patient Instructions (Signed)
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
## 2022-02-04 ENCOUNTER — Telehealth: Payer: Self-pay | Admitting: Cardiovascular Disease

## 2022-02-04 MED ORDER — ROSUVASTATIN CALCIUM 20 MG PO TABS: 20.0000 mg | ORAL_TABLET | Freq: Every day | ORAL | 3 refills | Status: DC

## 2022-02-04 NOTE — Telephone Encounter (Signed)
*  STAT* If patient is at the pharmacy, call can be transferred to refill team.   1. Which medications need to be refilled? (please list name of each medication and dose if known)   rosuvastatin (CRESTOR) 20 MG tablet  2. Which pharmacy/location (including street and city if local pharmacy) is medication to be sent to?  [facility] [id number] - [place], [place] - [address]  3. Do they need a 30 day or 90 day supply?   90 day  Patient called stating he has 4 tablets left.

## 2022-06-18 ENCOUNTER — Encounter: Payer: Self-pay | Admitting: Cardiovascular Disease

## 2022-06-18 ENCOUNTER — Ambulatory Visit: Payer: PPO | Attending: Cardiovascular Disease | Admitting: Cardiovascular Disease

## 2022-06-18 VITALS — BP 132/64 | HR 69 | Ht 73.0 in | Wt 275.4 lb

## 2022-06-18 DIAGNOSIS — I25118 Atherosclerotic heart disease of native coronary artery with other forms of angina pectoris: Secondary | ICD-10-CM | POA: Diagnosis not present

## 2022-06-18 DIAGNOSIS — E668 Other obesity: Secondary | ICD-10-CM | POA: Diagnosis not present

## 2022-06-18 DIAGNOSIS — N522 Drug-induced erectile dysfunction: Secondary | ICD-10-CM

## 2022-06-18 DIAGNOSIS — E785 Hyperlipidemia, unspecified: Secondary | ICD-10-CM

## 2022-06-18 DIAGNOSIS — I358 Other nonrheumatic aortic valve disorders: Secondary | ICD-10-CM

## 2022-06-18 DIAGNOSIS — I10 Essential (primary) hypertension: Secondary | ICD-10-CM

## 2022-06-18 MED ORDER — AMLODIPINE BESYLATE 5 MG PO TABS: 5.0000 mg | ORAL_TABLET | Freq: Every day | ORAL | 3 refills | Status: DC

## 2022-06-18 NOTE — Patient Instructions (Signed)
  Follow-Up: At [facility], you and your health needs are our priority.  As part of our continuing mission to provide you with exceptional heart care, we have created designated Provider Care Teams.  These Care Teams include your primary Cardiologist (physician) and Advanced Practice Providers (APPs -  Physician Assistants and Nurse Practitioners) who all work together to provide you with the care you need, when you need it.  We recommend signing up for the patient portal called "MyChart".  Sign up information is provided on this After Visit Summary.  MyChart is used to connect with patients for Virtual Visits (Telemedicine).  Patients are able to view lab/test results, encounter notes, upcoming appointments, etc.  Non-urgent messages can be sent to your provider as well.   To learn more about what you can do with MyChart, go to [email or web address].    Your next appointment:   12 month(s)  The format for your next appointment:   In Person  Provider:   Mihai Croitoru, MD      
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
## 2023-07-01 ENCOUNTER — Emergency Department (HOSPITAL_COMMUNITY): Payer: PPO

## 2023-07-01 ENCOUNTER — Emergency Department (HOSPITAL_COMMUNITY)
Admission: EM | Admit: 2023-07-01 | Discharge: 2023-07-02 | Disposition: A | Discharge status: Home / Self Care | Payer: PPO | Attending: Emergency Medicine | Admitting: Emergency Medicine

## 2023-07-01 ENCOUNTER — Encounter (HOSPITAL_COMMUNITY): Payer: Self-pay

## 2023-07-01 ENCOUNTER — Other Ambulatory Visit: Payer: Self-pay

## 2023-07-01 DIAGNOSIS — M1611 Unilateral primary osteoarthritis, right hip: Secondary | ICD-10-CM | POA: Diagnosis not present

## 2023-07-01 DIAGNOSIS — Z79899 Other long term (current) drug therapy: Secondary | ICD-10-CM | POA: Insufficient documentation

## 2023-07-01 DIAGNOSIS — Z20822 Contact with and (suspected) exposure to covid-19: Secondary | ICD-10-CM | POA: Insufficient documentation

## 2023-07-01 DIAGNOSIS — I700 Atherosclerosis of aorta: Secondary | ICD-10-CM | POA: Diagnosis not present

## 2023-07-01 DIAGNOSIS — I252 Old myocardial infarction: Secondary | ICD-10-CM | POA: Insufficient documentation

## 2023-07-01 DIAGNOSIS — R9431 Abnormal electrocardiogram [ECG] [EKG]: Secondary | ICD-10-CM | POA: Insufficient documentation

## 2023-07-01 DIAGNOSIS — R531 Weakness: Secondary | ICD-10-CM | POA: Insufficient documentation

## 2023-07-01 DIAGNOSIS — Z955 Presence of coronary angioplasty implant and graft: Secondary | ICD-10-CM | POA: Insufficient documentation

## 2023-07-01 DIAGNOSIS — R262 Difficulty in walking, not elsewhere classified: Secondary | ICD-10-CM | POA: Insufficient documentation

## 2023-07-01 DIAGNOSIS — I10 Essential (primary) hypertension: Secondary | ICD-10-CM | POA: Insufficient documentation

## 2023-07-01 DIAGNOSIS — I2510 Atherosclerotic heart disease of native coronary artery without angina pectoris: Secondary | ICD-10-CM | POA: Diagnosis not present

## 2023-07-01 DIAGNOSIS — K4090 Unilateral inguinal hernia, without obstruction or gangrene, not specified as recurrent: Secondary | ICD-10-CM | POA: Insufficient documentation

## 2023-07-01 DIAGNOSIS — I639 Cerebral infarction, unspecified: Secondary | ICD-10-CM | POA: Diagnosis present

## 2023-07-01 DIAGNOSIS — J189 Pneumonia, unspecified organism: Secondary | ICD-10-CM | POA: Diagnosis present

## 2023-07-01 DIAGNOSIS — Z7982 Long term (current) use of aspirin: Secondary | ICD-10-CM | POA: Diagnosis not present

## 2023-07-01 LAB — COMPREHENSIVE METABOLIC PANEL
ALT: 18 U/L (ref 0–44)
AST: 17 U/L (ref 15–41)
Albumin: 3.9 g/dL (ref 3.5–5.0)
Alkaline Phosphatase: 66 U/L (ref 38–126)
Anion gap: 11 (ref 5–15)
BUN: 16 mg/dL (ref 8–23)
CO2: 20 mmol/L — ABNORMAL LOW (ref 22–32)
Calcium: 8.9 mg/dL (ref 8.9–10.3)
Chloride: 105 mmol/L (ref 98–111)
Creatinine, Ser: 1.17 mg/dL (ref 0.61–1.24)
GFR, Estimated: >60 mL/min (ref >60)
Glucose, Bld: 130 mg/dL — ABNORMAL HIGH (ref 70–99)
Potassium: 4.2 mmol/L (ref 3.5–5.1)
Sodium: 136 mmol/L (ref 135–145)
Total Bilirubin: 1 mg/dL (ref 0.0–1.2)
Total Protein: 7.2 g/dL (ref 6.5–8.1)

## 2023-07-01 LAB — RESP PANEL BY RT-PCR (RSV, FLU A&B, COVID)  RVPGX2
Influenza A by PCR: NEGATIVE
Influenza B by PCR: NEGATIVE
Resp Syncytial Virus by PCR: NEGATIVE
SARS Coronavirus 2 by RT PCR: NEGATIVE

## 2023-07-01 LAB — CBC WITH DIFFERENTIAL/PLATELET
Abs Immature Granulocytes: 0.04 10*3/uL (ref 0.00–0.07)
Basophils Absolute: 0.1 10*3/uL (ref 0.0–0.1)
Basophils Relative: 1 %
Eosinophils Absolute: 0 10*3/uL (ref 0.0–0.5)
Eosinophils Relative: 1 %
HCT: 45.8 % (ref 39.0–52.0)
Hemoglobin: 15.6 g/dL (ref 13.0–17.0)
Immature Granulocytes: 1 %
Lymphocytes Relative: 7 %
Lymphs Abs: 0.4 10*3/uL — ABNORMAL LOW (ref 0.7–4.0)
MCH: 32.6 pg (ref 26.0–34.0)
MCHC: 34.1 g/dL (ref 30.0–36.0)
MCV: 95.6 fL (ref 80.0–100.0)
Monocytes Absolute: 0.9 10*3/uL (ref 0.1–1.0)
Monocytes Relative: 15 %
Neutro Abs: 4.8 10*3/uL (ref 1.7–7.7)
Neutrophils Relative %: 75 %
Platelets: 183 10*3/uL (ref 150–400)
RBC: 4.79 MIL/uL (ref 4.22–5.81)
RDW: 12.6 % (ref 11.5–15.5)
WBC: 6.3 10*3/uL (ref 4.0–10.5)
nRBC: 0 % (ref 0.0–0.2)

## 2023-07-01 LAB — URINALYSIS, W/ REFLEX TO CULTURE (INFECTION SUSPECTED)
Bacteria, UA: NONE SEEN
Bilirubin Urine: NEGATIVE
Glucose, UA: NEGATIVE mg/dL
Hgb urine dipstick: NEGATIVE
Ketones, ur: NEGATIVE mg/dL
Leukocytes,Ua: NEGATIVE
Nitrite: NEGATIVE
Protein, ur: NEGATIVE mg/dL
Specific Gravity, Urine: 1.02 (ref 1.005–1.030)
pH: 7 (ref 5.0–8.0)

## 2023-07-01 LAB — APTT: aPTT: 31 s (ref 24–36)

## 2023-07-01 LAB — I-STAT CG4 LACTIC ACID, ED: Lactic Acid, Venous: 1.2 mmol/L (ref 0.5–1.9)

## 2023-07-01 LAB — PROTIME-INR
INR: 1.1 (ref 0.8–1.2)
Prothrombin Time: 14.4 s (ref 11.4–15.2)

## 2023-07-01 MED ORDER — ACETAMINOPHEN 325 MG PO TABS
650.0000 mg | ORAL_TABLET | Freq: Four times a day (QID) | ORAL | Status: DC | PRN
  Administered 2023-07-01: 650 mg via ORAL
  Filled 2023-07-01: qty 2

## 2023-07-01 MED ORDER — LACTATED RINGERS IV BOLUS
500.0000 mL | Freq: Once | INTRAVENOUS | Status: AC
  Administered 2023-07-01: 500 mL via INTRAVENOUS

## 2023-07-01 MED ORDER — AMLODIPINE BESYLATE 5 MG PO TABS
5.0000 mg | ORAL_TABLET | ORAL | Status: DC
  Administered 2023-07-01: 5 mg via ORAL
  Filled 2023-07-01: qty 1

## 2023-07-01 MED ORDER — ROSUVASTATIN CALCIUM 5 MG PO TABS
20.0000 mg | ORAL_TABLET | Freq: Every day | ORAL | Status: DC
  Administered 2023-07-01: 20 mg via ORAL
  Filled 2023-07-01: qty 1

## 2023-07-01 MED ORDER — ASPIRIN 81 MG PO TBEC
81.0000 mg | DELAYED_RELEASE_TABLET | Freq: Every day | ORAL | Status: DC
  Administered 2023-07-01: 81 mg via ORAL
  Filled 2023-07-01: qty 1
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
## 2023-07-01 NOTE — Care Management (Signed)
ED RNCM received a call from A. Karie Mainland PA-C concerning HH and DME needs. Patient was evaluated by PT in the ED and recommendations was HHPT with r/w and bedside commode with family support.  Discuss with patient's family  HH and DME verbalized agreeable to accept services, and equipment. Offered choice, family does not have a preference of HH company.  Referral was sent to [facility] for [facility] services and Referral for equipment sent to [facility], to be delivered to the home tomorrow. Family is agreeable, no further ED TOC needs identified.

## 2023-07-01 NOTE — ED Provider Triage Note (Signed)
Emergency Medicine Provider Triage Evaluation Note  Jesus [name] , a 78 y.o. male  was evaluated in triage.  Pt complains of feeling extremely weak and unable to get out of bed on his own today.  Patient reports 2 weeks ago he was diagnosed with the flu and pneumonia but he has completed the antibiotics he feels like he is eating okay and today he could not get up.  He has no chest pain, shortness of breath or abdominal pain at this time.  Review of Systems  Positive: Weakness Negative: Chest pain, shortness of breath, abdominal pain, nausea or vomiting  Physical Exam  BP 127/67 (BP Location: Right Arm)   Pulse 87   Temp 100.2 F (37.9 C) (Oral)   Resp 17   Ht 6\' 1"  (1.854 m)   Wt 111.1 kg   SpO2 97%   BMI 32.32 kg/m  Gen:   Awake, no distress   Resp:  Normal effort  MSK:   Moves extremities without difficulty able to move the legs but reports they feel like they have anchors on them Other:  No abdominal pain  Medical Decision Making  Medically screening exam initiated at 11:09 AM.  Appropriate orders placed.  Jesus [name] was informed that the remainder of the evaluation will be completed by another provider, this initial triage assessment does not replace that evaluation, and the importance of remaining in the ED until their evaluation is complete.  Here with complaints of generalized weakness in the setting of having recent flu and pneumonia that was treated at home with antibiotics.  Patient is noted to be febrile here at 100.2.  Concern for secondary infection after having flu.  Heart rate and blood pressure are normal.  Undifferentiated sepsis order set initiated.   Jesus Sprout, MD 07/01/23 1110

## 2023-07-01 NOTE — ED Notes (Signed)
2nd lactic not needed per MD

## 2023-07-01 NOTE — Discharge Instructions (Addendum)
Your workup today was overall reassuring.  You did work with physical therapy and had some limitations in ambulation.  I have placed for home health nurse, physical therapist.  For any emergent symptoms return to the emergency room.

## 2023-07-01 NOTE — Evaluation (Signed)
Physical Therapy Evaluation Patient Details Name: Jesus [name] MRN: [id number] DOB: 18-Nov-1945 Today's Date: 07/01/2023  History of Present Illness  Patient is a 78 y/o male seen in ED 07/01/23 due to weakness and difficulty ambulating.  Recent diagnosis of PNA, but reports resolved.  PMH positive for HLD, HTN, CAD, NSTEMI 2009 s/p stents.  Clinical Impression  Patient presents with decreased mobility due to generalized weakness, decreased ROM R hip, decreased balance with posterior LOB in sitting.  Patient previously has had episodes evidently of limited mobility reporting crawling at times.  States undergoing work up for his R hip, though also with back pain.  States hopeful for home and pt able to ambulate this session though great difficulty getting up from supine on stretcher and back to bed.  Patient and family eager for home.  Could return with family support and HHPT with RW and 3:1.  PT will follow up if not d/c.         If plan is discharge home, recommend the following: Assistance with cooking/housework;Assist for transportation;Help with stairs or ramp for entrance;A little help with bathing/dressing/bathroom;A little help with walking and/or transfers   Can travel by private vehicle        Equipment Recommendations Rolling walker (2 wheels);BSC/3in1  Recommendations for Other Services       Functional Status Assessment Patient has had a recent decline in their functional status and demonstrates the ability to make significant improvements in function in a reasonable and predictable amount of time.     Precautions / Restrictions Precautions Precautions: Fall      Mobility  Bed Mobility Overal bed mobility: Needs Assistance Bed Mobility: Supine to Sit, Sit to Supine     Supine to sit: Max assist, HOB elevated, Used rails Sit to supine: +2 for physical assistance, Max assist   General bed mobility comments: up to edge of stretcher in hallway in ED with heavy lifting  help for trunk, pt able to move legs off EOB though unable to sit up; once finally at EOB still leaning back and difficulty more on R hip than L; sit to supine on stretcher +2 for legs and pivoting to lower head after several tries for pt to scoot back on bed and to lay on his R side first    Transfers Overall transfer level: Needs assistance Equipment used: Rolling walker (2 wheels) Transfers: Sit to/from Stand Sit to Stand: From elevated surface, Mod assist           General transfer comment: once able to lift up on R leg assist for balance with initial posterior lean    Ambulation/Gait Ambulation/Gait assistance: Mod assist, Min assist Gait Distance (Feet): 75 Feet Assistive device: Rolling walker (2 wheels) Gait Pattern/deviations: Step-to pattern, Shuffle, Leaning posteriorly, Decreased stride length, Wide base of support       General Gait Details: using bari walker able to walk initially with posterior support gradually reducing to min A for safety due to wide BOS, shuffling feet and at times knees flexing.  Stairs            Wheelchair Mobility     Tilt Bed    Modified Rankin (Stroke Patients Only)       Balance Overall balance assessment: Needs assistance   Sitting balance-Leahy Scale: Zero Sitting balance - Comments: heavy support to maintain sitting EOB initially, once walked able to sit EOB with S though difficulty scooting back and eventually needing help for positioning in bed/stretcher Postural  control: Posterior lean Standing balance support: Bilateral upper extremity supported, Reliant on assistive device for balance Standing balance-Leahy Scale: Poor Standing balance comment: UE support for balance and initially mod A progressing to min                             Pertinent Vitals/Pain Pain Assessment Pain Assessment: 0-10 Pain Score: 5  Pain Location: R hip and back at belt level with mobility Pain Descriptors / Indicators:  Discomfort, Sore Pain Intervention(s): Monitored during session, Repositioned    Home Living Family/patient expects to be discharged to:: Private residence Living Arrangements: Spouse/significant other Available Help at Discharge: Family Type of Home: House Home Access: Stairs to enter Entrance Stairs-Rails:  Armed forces technical officer) Secretary/administrator of Steps: 2 Alternate Level Stairs-Number of Steps: flight with landing Home Layout: Two level;Bed/bath upstairs Home Equipment: None Additional Comments: reports sleeping downstairs on couch for a while    Prior Function Prior Level of Function : Independent/Modified Independent             Mobility Comments: normally walks without device, though at times pt report he has had to crawl due to R hip acting up, but never this bad       Extremity/Trunk Assessment   Upper Extremity Assessment Upper Extremity Assessment: Overall WFL for tasks assessed    Lower Extremity Assessment Lower Extremity Assessment: LLE deficits/detail;RLE deficits/detail RLE Deficits / Details: unable to lift from hip antigravity, AAROM limited hip flexion about 60 degrees in supine, knee extension strength 4/5 RLE Sensation: WNL LLE Deficits / Details: AROM WFL, strength hip flexion 3/5, knee extension 4/5 LLE Sensation: WNL    Cervical / Trunk Assessment Cervical / Trunk Assessment: Other exceptions Cervical / Trunk Exceptions: stiffness in spine with mobility  Communication   Communication Communication: No apparent difficulties  Cognition Arousal: Alert Behavior During Therapy: WFL for tasks assessed/performed Overall Cognitive Status: Difficult to assess                                 General Comments: wife reports at baseline, pt giving contradictory information at times though then states he is joking        General Comments General comments (skin integrity, edema, etc.): spouse and son present, not seemingly alarmed, trying to  get pt home, discussed need for further follow up possibly for back issues.  Patient and wife educated in assist for coming up to sit with support from behind versus having something solid to hold onto,  also for entry with grabbar at 2 steps with L foot up first.  They report plans for home in [place] and discussed easier getting in but likely harder to get out.    Exercises     Assessment/Plan    PT Assessment Patient needs continued PT services  PT Problem List Decreased strength;Decreased range of motion;Decreased balance;Decreased mobility;Pain;Decreased activity tolerance;Decreased safety awareness;Decreased knowledge of use of DME       PT Treatment Interventions DME instruction;Gait training;Stair training;Therapeutic exercise;Therapeutic activities;Functional mobility training;Balance training;Neuromuscular re-education;Patient/family education    PT Goals (Current goals can be found in the Care Plan section)  Acute Rehab PT Goals Patient Stated Goal: to return home PT Goal Formulation: With patient/family Time For Goal Achievement: 07/15/23 Potential to Achieve Goals: Good    Frequency Min 1X/week     Co-evaluation  AM-PAC PT "6 Clicks" Mobility  Outcome Measure Help needed turning from your back to your side while in a flat bed without using bedrails?: A Lot Help needed moving from lying on your back to sitting on the side of a flat bed without using bedrails?: Total Help needed moving to and from a bed to a chair (including a wheelchair)?: A Lot Help needed standing up from a chair using your arms (e.g., wheelchair or bedside chair)?: A Lot Help needed to walk in hospital room?: A Lot Help needed climbing 3-5 steps with a railing? : A Lot 6 Click Score: 11    End of Session Equipment Utilized During Treatment: Gait belt Activity Tolerance: Patient limited by pain Patient left: in bed;with family/visitor present;with nursing/sitter in room Nurse  Communication: Mobility status PT Visit Diagnosis: Other abnormalities of gait and mobility (R26.89);Other symptoms and signs involving the nervous system (R29.898)    Time: 5956-3875 PT Time Calculation (min) (ACUTE ONLY): 28 min   Charges:   PT Evaluation $PT Eval Moderate Complexity: 1 Mod PT Treatments $Gait Training: 8-22 mins PT General Charges $$ ACUTE PT VISIT: 1 Visit         Sheran Lawless, PT Acute Rehabilitation Services Office:[phone] 07/01/2023   Elray Mcgregor 07/01/2023, 5:49 PM

## 2023-07-01 NOTE — ED Notes (Signed)
Patient and family asking for patient to get up and stand. Patient and family instructed not to get patient up from stretcher. Both verbalized understanding.

## 2023-07-01 NOTE — ED Notes (Signed)
Patient transported to CT 

## 2023-07-01 NOTE — ED Triage Notes (Addendum)
Pt to ED via [facility] from home. Pt is usually A&Ox4, ambulatory. Yesterday pt had an episode of weakness and when EMS arrived today, pt was confused and had urine and bowel incontinence. Pt has had a cough, seen for pna 1/1. No neuro deficits.  20g L hand  Pt A&Ox4, states he called EMS because he was too weak to stand up. Pt states he still feels weak and unsteady. Pt denies pain, denies any numbness or tingling.   EMS VS RR 34 150/90 HR 90 ETC02 35 Cbg 131

## 2023-07-01 NOTE — Care Management (Signed)
    Durable Medical Equipment  (From admission, onward)           Start     Ordered   07/01/23 1602  For home use only DME Walker rolling  Once       Question Answer Comment  Walker: With 5 Inch Wheels   Patient needs a walker to treat with the following condition Weakness      07/01/23 1601   07/01/23 1601  For home use only DME Bedside commode  Once       Question:  Patient needs a bedside commode to treat with the following condition  Answer:  Weakness   07/01/23 1601

## 2023-07-02 NOTE — ED Notes (Signed)
Per wife, they have now decided to take him home via their own car, ptar not needed.

## 2023-07-02 NOTE — Discharge Planning (Signed)
RNCM verified with pt that DME was delivered to him and reminded him that [facility] will contact him prior to first visit.

## 2023-07-06 LAB — CULTURE, BLOOD (ROUTINE X 2)
Culture: NO GROWTH
Culture: NO GROWTH
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
## 2023-07-21 ENCOUNTER — Other Ambulatory Visit: Payer: Self-pay | Admitting: Cardiovascular Disease

## 2023-07-28 ENCOUNTER — Ambulatory Visit: Payer: PPO | Attending: Cardiovascular Disease | Admitting: Cardiovascular Disease

## 2023-07-28 ENCOUNTER — Encounter: Payer: Self-pay | Admitting: Cardiovascular Disease

## 2023-07-28 VITALS — BP 110/60 | HR 67 | Ht 73.0 in | Wt 256.0 lb

## 2023-07-28 DIAGNOSIS — I10 Essential (primary) hypertension: Secondary | ICD-10-CM

## 2023-07-28 DIAGNOSIS — E785 Hyperlipidemia, unspecified: Secondary | ICD-10-CM | POA: Diagnosis not present

## 2023-07-28 DIAGNOSIS — I358 Other nonrheumatic aortic valve disorders: Secondary | ICD-10-CM | POA: Diagnosis not present

## 2023-07-28 DIAGNOSIS — N522 Drug-induced erectile dysfunction: Secondary | ICD-10-CM

## 2023-07-28 DIAGNOSIS — I2510 Atherosclerotic heart disease of native coronary artery without angina pectoris: Secondary | ICD-10-CM | POA: Diagnosis not present

## 2023-07-28 DIAGNOSIS — R0683 Snoring: Secondary | ICD-10-CM

## 2023-07-28 NOTE — Patient Instructions (Signed)
Medication Instructions:  Stop amlodopine. Keep blood pressure log on sheet provided and submit to office for review.  *If you need a refill on your cardiac medications before your next appointment, please call your pharmacy*   Testing/Procedures: Your physician has requested that you have an echocardiogram. Echocardiography is a painless test that uses sound waves to create images of your heart. It provides your doctor with information about the size and shape of your heart and how well your heart's chambers and valves are working. This procedure takes approximately one hour. There are no restrictions for this procedure. Please do NOT wear cologne, perfume, aftershave, or lotions (deodorant is allowed). Please arrive 15 minutes prior to your appointment time.  Please note: We ask at that you not bring children with you during ultrasound (echo/ vascular) testing. Due to room size and safety concerns, children are not allowed in the ultrasound rooms during exams. Our front office staff cannot provide observation of children in our lobby area while testing is being conducted. An adult accompanying a patient to their appointment will only be allowed in the ultrasound room at the discretion of the ultrasound technician under special circumstances. We apologize for any inconvenience.   WatchPAT?  Is a FDA cleared portable home sleep study test that uses a watch and 3 points of contact to monitor 7 different channels, including your heart rate, oxygen saturations, body position, snoring, and chest motion.  The study is easy to use from the comfort of your own home and accurately detect sleep apnea.  Before bed, you attach the chest sensor, attached the sleep apnea bracelet to your nondominant hand, and attach the finger probe.  After the study, the raw data is downloaded from the watch and scored for apnea events.   For more information: [email or web address]  Patient Testing  Instructions:  Do not put battery into the device until bedtime when you are ready to begin the test. Please call the support number if you need assistance after following the instructions below: 24 hour support line- [phone] or [facility] support at [phone] (option 2)  Download the IntelWatchPAT One" app through the google play store or App Store  Be sure to turn on or enable access to bluetooth in settlings on your smartphone/ device  Make sure no other bluetooth devices are on and within the vicinity of your smartphone/ device and WatchPAT watch during testing.  Make sure to leave your smart phone/ device plugged in and charging all night.  When ready for bed:  Follow the instructions step by step in the WatchPAT One App to activate the testing device. For additional instructions, including video instruction, visit the WatchPAT One video on Youtube. You can search for WatchPat One within Youtube (video is 4 minutes and 18 seconds) or enter: https://youtube/watch?[email or web address]_vbiwxE Please note: You will be prompted to enter a Pin to connect via bluetooth when starting the test. The PIN will be assigned to you when you receive the test.  The device is disposable, but it recommended that you retain the device until you receive a call letting you know the study has been received and the results have been interpreted.  We will let you know if the study did not transmit to [place] properly after the test is completed. You do not need to call us to confirm the receipt of the test.  Please complete the test within 48 hours of receiving PIN.   Frequently Asked Questions:  What is Watch Dennie Bible one?  A single  use fully disposable home sleep apnea testing device and will not need to be returned after completion.  What are the requirements to use WatchPAT one?  The be able to have a successful watchpat one sleep study, you should have your Watch pat one device, your smart phone, watch pat one app, your PIN  number and Internet access What type of phone do I need?  You should have a smart phone that uses Android 5.1 and above or any Iphone with IOS 10 and above How can I download the WatchPAT one app?  Based on your device type search for WatchPAT one app either in google play for android devices or APP store for Iphone's Where will I get my PIN for the study?  Your PIN will be provided by your physician's office. It is used for authentication and if you lose/forget your PIN, please reach out to your providers office.  I do not have Internet at home. Can I do WatchPAT one study?  WatchPAT One needs Internet connection throughout the night to be able to transmit the sleep data. You can use your home/local internet or your cellular's data package. However, it is always recommended to use home/local Internet. It is estimated that between 20MB-30MB will be used with each study.However, the application will be looking for space in the phone to start the study.  What happens if I lose internet or bluetooth connection?  During the internet disconnection, your phone will not be able to transmit the sleep data. All the data, will be stored in your phone. As soon as the internet connection is back on, the phone will being sending the sleep data. During the bluetooth disconnection, WatchPAT one will not be able to to send the sleep data to your phone. Data will be kept in the [facility] one until two devices have bluetooth connection back on. As soon as the connection is back on, WatchPAT one will send the sleep data to the phone.  How long do I need to wear the WatchPAT one?  After you start the study, you should wear the device at least 6 hours.  How far should I keep my phone from the device?  During the night, your phone should be within 15 feet.  What happens if I leave the room for restroom or other reasons?  Leaving the room for any reason will not cause any problem. As soon as your get back to the room,  both devices will reconnect and will continue to send the sleep data. Can I use my phone during the sleep study?  Yes, you can use your phone as usual during the study. But it is recommended to put your watchpat one on when you are ready to go to bed.  How will I get my study results?  A soon as you completed your study, your sleep data will be sent to the provider. They will then share the results with you when they are ready.      Follow-Up: At [facility], you and your health needs are our priority.  As part of our continuing mission to provide you with exceptional heart care, we have created designated Provider Care Teams.  These Care Teams include your primary Cardiologist (physician) and Advanced Practice Providers (APPs -  Physician Assistants and Nurse Practitioners) who all work together to provide you with the care you need, when you need it.  We recommend signing up for the patient portal called "MyChart".  Sign up  information is provided on this After Visit Summary.  MyChart is used to connect with patients for Virtual Visits (Telemedicine).  Patients are able to view lab/test results, encounter notes, upcoming appointments, etc.  Non-urgent messages can be sent to your provider as well.   To learn more about what you can do with MyChart, go to [email or web address].    Your next appointment:   1 year(s)  Provider:   Thurmon Fair, MD     Other Instructions

## 2023-08-11 ENCOUNTER — Telehealth: Payer: Self-pay | Admitting: Cardiovascular Disease

## 2023-08-11 NOTE — Telephone Encounter (Signed)
 Patient brought in BP reading will leave in provider box

## 2023-08-19 ENCOUNTER — Other Ambulatory Visit: Payer: Self-pay

## 2023-08-19 MED ORDER — AMLODIPINE BESYLATE 2.5 MG PO TABS: 2.5000 mg | ORAL_TABLET | Freq: Every day | ORAL | 3 refills | Status: AC

## 2023-08-19 NOTE — Progress Notes (Signed)
 Amlodipine 2.5 mg once daily sent to the pharmacy on file. MyChart message will be sent to pt to inform his this was completed.

## 2023-08-26 ENCOUNTER — Ambulatory Visit (HOSPITAL_COMMUNITY)
Admission: RE | Admit: 2023-08-26 | Discharge: 2023-08-26 | Disposition: A | Discharge status: Home / Self Care | Payer: PPO | Source: Ambulatory Visit | Attending: Internal Medicine | Admitting: Internal Medicine

## 2023-08-26 ENCOUNTER — Encounter: Payer: Self-pay | Admitting: Cardiovascular Disease

## 2023-08-26 ENCOUNTER — Telehealth: Payer: Self-pay | Admitting: Cardiovascular Disease

## 2023-08-26 DIAGNOSIS — I2510 Atherosclerotic heart disease of native coronary artery without angina pectoris: Secondary | ICD-10-CM | POA: Insufficient documentation

## 2023-08-26 LAB — ECHOCARDIOGRAM COMPLETE
AR max vel: 4.2 cm2
AV Area VTI: 4.3 cm2
AV Area mean vel: 3.6 cm2
AV Mean grad: 3 mmHg
AV Peak grad: 5.8 mmHg
Ao pk vel: 1.2 m/s
Area-P 1/2: 2.46 cm2
S' Lateral: 3.27 cm

## 2023-08-26 NOTE — Telephone Encounter (Signed)
 Paper Work Dropped Off: BP and HR readings   Date: 08/26/2023  Location of paper:  Dr. Salena Saner box

## 2023-08-29 NOTE — Telephone Encounter (Signed)
 BP log: 3/13 170/86, HR 62 3/14 152/81, HR 65 3/15 156/83, HR 62 3/16 159/84, HR 65 3/17 139/81, HR 65 3/18 148/81, HR 64   Dr. Royann Shivers reviewed.  Called pt and advised to continue monitoring for now per MD.  Advised to keep another week and send via mychart or drop off for review.  Patient agreeable to plan.

## 2023-10-07 ENCOUNTER — Ambulatory Visit: Payer: PPO | Admitting: Neurology

## 2023-10-07 ENCOUNTER — Encounter: Payer: Self-pay | Admitting: Neurology

## 2023-10-07 VITALS — BP 129/76 | HR 74 | Ht 73.0 in | Wt 262.0 lb

## 2023-10-07 DIAGNOSIS — R269 Unspecified abnormalities of gait and mobility: Secondary | ICD-10-CM

## 2023-10-07 DIAGNOSIS — I951 Orthostatic hypotension: Secondary | ICD-10-CM | POA: Diagnosis not present
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
## 2023-10-07 NOTE — Patient Instructions (Signed)
 Continue current medications Increase water intake, consider adding liquid IV to your water Continue to follow with PCP Referral to physical therapy for gait training Return as needed.

## 2023-10-13 ENCOUNTER — Telehealth: Payer: Self-pay

## 2023-10-13 NOTE — Telephone Encounter (Signed)
**Note De-Identified [name] Obfuscation** Ordering provider: Dr Alvis Ba Associated diagnoses: Snoring-R06.83 and Hypersomnolence-G47.10  WatchPAT PA obtained on 10/13/2023 by Lavina Resor, Isabella Mao, LPN. Authorization: Per the [facility] provider portal: Decision 1: Approved Service Dates: 10/13/2023 - 01/11/2024 Outpatient Authorization [id number]  Patient notified of PIN (1234) on 10/13/2023 [name] Notification Method: MyChart message. I called the pt but got no answer so I left a message on his VM (Ok per [facility]) advising him of the WatchPAT One-HST device Pin # of "1234" and I advised him that I have sent him a [facility] message and that if he has any questions or concerns after reading it to contact us  at [phone].  Phone note routed to covering staff for follow-up.

## 2023-10-20 ENCOUNTER — Other Ambulatory Visit: Payer: Self-pay | Admitting: *Deleted

## 2023-10-20 MED ORDER — ROSUVASTATIN CALCIUM 20 MG PO TABS: 20.0000 mg | ORAL_TABLET | Freq: Every day | ORAL | 2 refills | Status: AC

## 2023-11-19 ENCOUNTER — Telehealth: Payer: Self-pay | Admitting: Emergency Medicine

## 2023-11-19 NOTE — Telephone Encounter (Addendum)
 Left message stating that our records show that he has not completed his home sleep study yet. Asked to please complete the study, or he will need to send it back to us .  Left call back number.   Also sent MyChart message

## 2024-03-22 ENCOUNTER — Telehealth: Payer: Self-pay | Admitting: Cardiovascular Disease

## 2024-03-22 NOTE — Telephone Encounter (Signed)
 Sari from [facility] call stating they received office notes from this patient, but are not requesting the echo report for this patient. Can be faxed to [phone].

## 2024-03-22 NOTE — Telephone Encounter (Signed)
 Echo faxed to provided number

## 2024-06-20 LAB — COLOGUARD: COLOGUARD: POSITIVE — AB

## 2024-09-08 ENCOUNTER — Ambulatory Visit: Admitting: Cardiovascular Disease
